# Patient Record
Sex: Female | Born: 1940
Health system: Southern US, Community
[De-identification: ages and names within clinical notes are randomized; demographics above are authoritative.]

## PROBLEM LIST (undated history)

## (undated) DIAGNOSIS — Z9889 Other specified postprocedural states: Secondary | ICD-10-CM

## (undated) DIAGNOSIS — G43909 Migraine, unspecified, not intractable, without status migrainosus: Secondary | ICD-10-CM

## (undated) DIAGNOSIS — K635 Polyp of colon: Secondary | ICD-10-CM

## (undated) DIAGNOSIS — J189 Pneumonia, unspecified organism: Secondary | ICD-10-CM

## (undated) DIAGNOSIS — Z8669 Personal history of other diseases of the nervous system and sense organs: Secondary | ICD-10-CM

## (undated) DIAGNOSIS — B351 Tinea unguium: Secondary | ICD-10-CM

## (undated) DIAGNOSIS — E039 Hypothyroidism, unspecified: Secondary | ICD-10-CM

## (undated) DIAGNOSIS — E785 Hyperlipidemia, unspecified: Secondary | ICD-10-CM

## (undated) DIAGNOSIS — M8580 Other specified disorders of bone density and structure, unspecified site: Secondary | ICD-10-CM

## (undated) HISTORY — DX: Tinea unguium: B35.1

## (undated) HISTORY — PX: TUBAL LIGATION: SHX77

## (undated) HISTORY — PX: EYE SURGERY: SHX253

## (undated) HISTORY — DX: Personal history of other diseases of the nervous system and sense organs: Z86.69

## (undated) HISTORY — PX: RETINAL DETACHMENT SURGERY: SHX105

## (undated) HISTORY — DX: Migraine, unspecified, not intractable, without status migrainosus: G43.909

## (undated) HISTORY — DX: Other specified postprocedural states: Z98.890

## (undated) HISTORY — DX: Pneumonia, unspecified organism: J18.9

## (undated) HISTORY — DX: Hyperlipidemia, unspecified: E78.5

## (undated) HISTORY — DX: Polyp of colon: K63.5

## (undated) HISTORY — DX: Other specified disorders of bone density and structure, unspecified site: M85.80

## (undated) HISTORY — DX: Hypothyroidism, unspecified: E03.9

---

## 2004-02-05 ENCOUNTER — Encounter: Admission: RE | Admit: 2004-02-05 | Discharge: 2004-02-05 | Payer: Self-pay | Admitting: Internal Medicine

## 2005-08-31 ENCOUNTER — Other Ambulatory Visit: Admission: RE | Admit: 2005-08-31 | Discharge: 2005-08-31 | Payer: Self-pay | Admitting: Obstetrics and Gynecology

## 2009-04-08 ENCOUNTER — Other Ambulatory Visit: Admission: RE | Admit: 2009-04-08 | Discharge: 2009-04-08 | Payer: Self-pay | Admitting: Obstetrics and Gynecology

## 2009-04-08 ENCOUNTER — Ambulatory Visit: Payer: Self-pay | Admitting: Women's Health

## 2010-12-31 ENCOUNTER — Encounter: Payer: Self-pay | Admitting: Gastroenterology

## 2011-08-26 ENCOUNTER — Telehealth: Payer: Self-pay

## 2011-08-26 NOTE — Telephone Encounter (Signed)
Notified husband for pt to call pharmacy to send in request

## 2011-08-26 NOTE — Telephone Encounter (Signed)
Patient made an appointment for a physical with Dr. Merla Riches in August but needs to discuss how to get her medication through her insurance company before then. Please call her to advise her of this issue.

## 2011-11-16 ENCOUNTER — Encounter: Payer: Self-pay | Admitting: Internal Medicine

## 2011-11-16 ENCOUNTER — Ambulatory Visit (INDEPENDENT_AMBULATORY_CARE_PROVIDER_SITE_OTHER): Payer: BC Managed Care – PPO | Admitting: Internal Medicine

## 2011-11-16 VITALS — BP 123/85 | HR 73 | Temp 97.0°F | Resp 18 | Ht 64.5 in | Wt 134.8 lb

## 2011-11-16 DIAGNOSIS — H3320 Serous retinal detachment, unspecified eye: Secondary | ICD-10-CM

## 2011-11-16 DIAGNOSIS — M8580 Other specified disorders of bone density and structure, unspecified site: Secondary | ICD-10-CM

## 2011-11-16 DIAGNOSIS — Z0000 Encounter for general adult medical examination without abnormal findings: Secondary | ICD-10-CM

## 2011-11-16 DIAGNOSIS — K219 Gastro-esophageal reflux disease without esophagitis: Secondary | ICD-10-CM | POA: Insufficient documentation

## 2011-11-16 DIAGNOSIS — M899 Disorder of bone, unspecified: Secondary | ICD-10-CM

## 2011-11-16 DIAGNOSIS — E039 Hypothyroidism, unspecified: Secondary | ICD-10-CM | POA: Insufficient documentation

## 2011-11-16 DIAGNOSIS — G43909 Migraine, unspecified, not intractable, without status migrainosus: Secondary | ICD-10-CM

## 2011-11-16 LAB — CBC WITH DIFFERENTIAL/PLATELET
Basophils Absolute: 0 10*3/uL (ref 0.0–0.1)
Basophils Relative: 1 % (ref 0–1)
Eosinophils Absolute: 0.2 10*3/uL (ref 0.0–0.7)
Eosinophils Relative: 3 % (ref 0–5)
HCT: 41.7 % (ref 36.0–46.0)
Hemoglobin: 15.2 g/dL — ABNORMAL HIGH (ref 12.0–15.0)
Lymphocytes Relative: 41 % (ref 12–46)
Lymphs Abs: 2.2 10*3/uL (ref 0.7–4.0)
MCH: 33.4 pg (ref 26.0–34.0)
MCHC: 36.5 g/dL — ABNORMAL HIGH (ref 30.0–36.0)
MCV: 91.6 fL (ref 78.0–100.0)
Monocytes Absolute: 0.4 10*3/uL (ref 0.1–1.0)
Monocytes Relative: 8 % (ref 3–12)
Neutro Abs: 2.7 10*3/uL (ref 1.7–7.7)
Neutrophils Relative %: 47 % (ref 43–77)
Platelets: 223 10*3/uL (ref 150–400)
RBC: 4.55 MIL/uL (ref 3.87–5.11)
RDW: 13.5 % (ref 11.5–15.5)
WBC: 5.5 10*3/uL (ref 4.0–10.5)

## 2011-11-16 NOTE — Progress Notes (Signed)
  Subjective:    Patient ID: Wanda Hernandez, female    DOB: Oct 21, 1940, 71 y.o.   MRN: [id number]  HPI    Review of Systems  Constitutional: Negative.   HENT: Negative.   Eyes: Negative.   Respiratory: Negative.   Cardiovascular: Negative.   Gastrointestinal: Negative.   Genitourinary: Negative.   Musculoskeletal: Negative.   Skin: Negative.   Neurological: Negative.   Hematological: Negative.   Psychiatric/Behavioral: Negative.        Objective:   Physical Exam        Assessment & Plan:

## 2011-11-16 NOTE — Progress Notes (Addendum)
  Subjective:    Patient ID: Wanda Hernandez, female    DOB: 06-09-1940, 71 y.o.   MRN: [id number]  HPIHere for annual physical exam and doing very well. 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 This is stressful but also prompts her to do in depth study in thinking about her livelihood. Most of her life revolves around being supportive mother Kids are out of the house: Olegario Messier moved to [place] Son in [place] lost job -IT/Wife IT also   Patient Active Problem List  Diagnosis  . Migraine-Now much less frequent/Rarely needs medication  . Osteopenia-Asymptomatic/prefers not taking medication  . Hypothyroid-Asymptomatic on replacement therapy  . Detached retina-No stable for over 10 years  . GERD (gastroesophageal reflux disease)-Now very mild needing medication once a week at most  Last colonoscopy 2005= within normal limits Avoided prescription for Zostavax for the last 2 years but will reconsider this year Skipped mammogram this yearAnd GYN exam for the last 2 years   Home BP-All normal/she has concern that she'll develop hypertension when she feels stressed Review of Systems 13 point review of systems on her pink datasheet are all negative    Objective:   Physical Exam Filed Vitals:   11/16/11 0846  BP: 123/85  Pulse: 73  Temp: 97 F (36.1 C)  Resp: 18  Well-developed well-nourished HEENT clear No thyromegaly or lymphadenopathy Heart regular without murmur Lungs clear Abdomen soft with no organomegaly masses or bruits Extremities clear full range of motion of major joints and full peripheral pulses Neuro intact Psychiatric intact Breasts with no masses or dimpling Skin clear     Assessment & Plan:  Annual physical examination Problem #1 hypothyroidism-recheck labs Problem #2 osteopenia-check calcium and vitamin D Other problems stable   Adden: synthr incr to 11/18/11

## 2011-11-17 LAB — COMPREHENSIVE METABOLIC PANEL
ALT: 19 U/L (ref 0–35)
AST: 22 U/L (ref 0–37)
Albumin: 4.9 g/dL (ref 3.5–5.2)
Alkaline Phosphatase: 50 U/L (ref 39–117)
BUN: 24 mg/dL — ABNORMAL HIGH (ref 6–23)
CO2: 26 mEq/L (ref 19–32)
Calcium: 9.9 mg/dL (ref 8.4–10.5)
Chloride: 103 mEq/L (ref 96–112)
Creat: 0.94 mg/dL (ref 0.50–1.10)
Glucose, Bld: 85 mg/dL (ref 70–99)
Potassium: 3.7 mEq/L (ref 3.5–5.3)
Sodium: 137 mEq/L (ref 135–145)
Total Bilirubin: 0.8 mg/dL (ref 0.3–1.2)
Total Protein: 6.8 g/dL (ref 6.0–8.3)

## 2011-11-17 LAB — TSH: TSH: 7.922 u[IU]/mL — ABNORMAL HIGH (ref 0.350–4.500)

## 2011-11-17 LAB — LIPID PANEL
Cholesterol: 233 mg/dL — ABNORMAL HIGH (ref 0–200)
HDL: 63 mg/dL (ref >39)
LDL Cholesterol: 144 mg/dL — ABNORMAL HIGH (ref 0–99)
Total CHOL/HDL Ratio: 3.7 Ratio
Triglycerides: 128 mg/dL (ref <150)
VLDL: 26 mg/dL (ref 0–40)

## 2011-11-17 LAB — VITAMIN D 25 HYDROXY (VIT D DEFICIENCY, FRACTURES): Vit D, 25-Hydroxy: 41 ng/mL (ref 30–89)

## 2011-11-17 LAB — T4, FREE: Free T4: 1.45 ng/dL (ref 0.80–1.80)

## 2011-11-18 ENCOUNTER — Other Ambulatory Visit: Payer: Self-pay | Admitting: Physician Assistant

## 2011-11-18 ENCOUNTER — Other Ambulatory Visit: Payer: Self-pay | Admitting: Internal Medicine

## 2011-11-18 MED ORDER — LEVOTHYROXINE SODIUM 100 MCG PO TABS: 100.0000 ug | ORAL_TABLET | Freq: Every day | ORAL | Status: DC

## 2011-11-18 NOTE — Addendum Note (Signed)
Addended by: Tonye Pearson on: 11/18/2011 09:39 AM   Modules accepted: Orders

## 2011-11-18 NOTE — Addendum Note (Signed)
Addended by: Jacqualyn Posey on: 11/18/2011 10:42 AM   Modules accepted: Orders

## 2011-11-18 NOTE — Telephone Encounter (Signed)
Dr. Merla Riches - please advise. Looks like her labs have not been reviewed and she may be under-treated.

## 2011-11-20 ENCOUNTER — Encounter: Payer: Self-pay | Admitting: Internal Medicine

## 2011-12-19 ENCOUNTER — Telehealth: Payer: Self-pay | Admitting: Obstetrics and Gynecology

## 2011-12-20 ENCOUNTER — Encounter: Payer: Self-pay | Admitting: Gastroenterology

## 2012-01-10 ENCOUNTER — Encounter: Payer: Self-pay | Admitting: Obstetrics and Gynecology

## 2012-01-10 ENCOUNTER — Ambulatory Visit (INDEPENDENT_AMBULATORY_CARE_PROVIDER_SITE_OTHER): Payer: BC Managed Care – PPO | Admitting: Obstetrics and Gynecology

## 2012-01-10 VITALS — BP 130/86 | HR 72 | Ht 65.0 in | Wt 136.0 lb

## 2012-01-10 DIAGNOSIS — Z124 Encounter for screening for malignant neoplasm of cervix: Secondary | ICD-10-CM

## 2012-01-10 NOTE — Progress Notes (Signed)
Last Pap: 2007 WNL: Yes Regular Periods:no Contraception: post menopausal  Monthly Breast exam:yes Tetanus<36yrs:no Nl.Bladder Function:yes Daily BMs:yes Healthy Diet:yes Calcium:yes in multivitamin Mammogram:yes Date of Mammogram: Pt has not had one in a few years Exercise:yes Have often Exercise: walking 3 times per week  Seatbelt: yes Abuse at home: no Stressful work:no Sigmoid-colonoscopy: years ago per pt, PCP has info Bone Density: Yes  PC[facility]: [facility] [address]  Change in PMH: n/a Change in FMH:n/a BP 130/86  Pulse 72  Ht 5\' 5"  (1.651 m)  Wt 136 lb (61.689 kg)  BMI 22.63 kg/m2 Pt with complaints:, yes and she thinks she may have a hernia on the lrft side of her abdomen.  She has no pain.   Physical Examination: General appearance - alert, well appearing, and in no distress Mental status - normal mood, behavior, speech, dress, motor activity, and thought processes Neck - supple, no significant adenopathy,  thyroid exam: thyroid is normal in size without nodules or tenderness Chest - clear to auscultation, no wheezes, rales or rhonchi, symmetric air entry Heart - normal rate and regular rhythm Abdomen - soft, nontender, nondistended, no masses or organomegaly.  No hernia palpated.   Breasts - breasts appear normal, no suspicious masses, no skin or nipple changes or axillary nodes Pelvic - normal external genitalia, vulva, vagina, cervix, uterus and adnexa Rectal - declined by the patient Back exam - full range of motion, no tenderness, palpable spasm or pain on motion Neurological - alert, oriented, normal speech, no focal findings or movement disorder noted Musculoskeletal - no joint tenderness, deformity or swelling Extremities - no edema, redness or tenderness in the calves or thighs Skin - normal coloration and turgor, no rashes, no suspicious skin lesions noted Routine exam Pap sent yes if normal repeat in 3 yrs Mammogram due yes will  scheudle BTL used for contraception RT 1 yr Pt told if her abdomen becomes worse f/u with PCP

## 2012-01-10 NOTE — Patient Instructions (Signed)
Exercise to Stay Healthy Exercise helps you become and stay healthy. EXERCISE IDEAS AND TIPS Choose exercises that:  You enjoy.  Fit into your day. You do not need to exercise really hard to be healthy. You can do exercises at a slow or medium level and stay healthy. You can:  Stretch before and after working out.  Try yoga, Pilates, or tai chi.  Lift weights.  Walk fast, swim, jog, run, climb stairs, bicycle, dance, or rollerskate.  Take aerobic classes. Exercises that burn about 150 calories:  Running 1  miles in 15 minutes.  Playing volleyball for 45 to 60 minutes.  Washing and waxing a car for 45 to 60 minutes.  Playing touch football for 45 minutes.  Walking 1  miles in 35 minutes.  Pushing a stroller 1  miles in 30 minutes.  Playing basketball for 30 minutes.  Raking leaves for 30 minutes.  Bicycling 5 miles in 30 minutes.  Walking 2 miles in 30 minutes.  Dancing for 30 minutes.  Shoveling snow for 15 minutes.  Swimming laps for 20 minutes.  Walking up stairs for 15 minutes.  Bicycling 4 miles in 15 minutes.  Gardening for 30 to 45 minutes.  Jumping rope for 15 minutes.  Washing windows or floors for 45 to 60 minutes. Document Released: 04/16/2010 Document Revised: 06/06/2011 Document Reviewed: 04/16/2010 Exit[employer] Patient Information 2013 [employer].  

## 2012-01-11 LAB — PAP IG W/ RFLX HPV ASCU

## 2012-02-03 DIAGNOSIS — H2510 Age-related nuclear cataract, unspecified eye: Secondary | ICD-10-CM | POA: Diagnosis not present

## 2012-02-21 ENCOUNTER — Telehealth: Payer: Self-pay

## 2012-02-21 MED ORDER — SYNTHROID 100 MCG PO TABS: 100.0000 ug | ORAL_TABLET | Freq: Every day | ORAL | Status: DC

## 2012-02-21 NOTE — Telephone Encounter (Signed)
Patient states Synthroid has not been refilled. She uses [facility] and by the first of next week she will be out. Patient states she has to have name brand and no generics. [phone] on to leave message

## 2012-02-21 NOTE — Telephone Encounter (Signed)
Pt reported that [facility] told her that they have released the last of her synthroid Rx and she doesn't know why they don't have the 3 RFs that were put on Rx (or maybe a PA is needed?). Called [facility] and a new Rx is needed stating DAW for the rest of the Rxs. I was told that no PA is needed but pt will have to pay the higher co-pay. Sending in Rx for the remaining RFs from Dr Doolittle's Rx from 10/2011 w/note DAW - name brand medically necessary. Notified pt of status.

## 2012-04-23 ENCOUNTER — Other Ambulatory Visit: Payer: Self-pay | Admitting: Physician Assistant

## 2012-05-24 ENCOUNTER — Other Ambulatory Visit: Payer: Self-pay | Admitting: Radiology

## 2012-05-24 NOTE — Telephone Encounter (Signed)
Please advise on name brand Synthroid for him.

## 2012-05-25 MED ORDER — LANSOPRAZOLE 30 MG PO CPDR: 30.0000 mg | DELAYED_RELEASE_CAPSULE | Freq: Every day | ORAL | Status: DC

## 2012-05-25 MED ORDER — SYNTHROID 100 MCG PO TABS: 100.0000 ug | ORAL_TABLET | Freq: Every day | ORAL | Status: DC

## 2012-12-26 ENCOUNTER — Encounter: Payer: Medicare Other | Admitting: Internal Medicine

## 2013-02-06 ENCOUNTER — Encounter: Payer: Self-pay | Admitting: Internal Medicine

## 2013-02-06 ENCOUNTER — Ambulatory Visit (INDEPENDENT_AMBULATORY_CARE_PROVIDER_SITE_OTHER): Payer: Medicare Other | Admitting: Internal Medicine

## 2013-02-06 VITALS — BP 133/92 | HR 68 | Temp 98.4°F | Wt 136.6 lb

## 2013-02-06 DIAGNOSIS — E039 Hypothyroidism, unspecified: Secondary | ICD-10-CM | POA: Diagnosis not present

## 2013-02-06 DIAGNOSIS — Z0000 Encounter for general adult medical examination without abnormal findings: Secondary | ICD-10-CM | POA: Diagnosis not present

## 2013-02-06 LAB — COMPREHENSIVE METABOLIC PANEL
ALT: 27 U/L (ref 0–35)
AST: 23 U/L (ref 0–37)
Albumin: 4.2 g/dL (ref 3.5–5.2)
Alkaline Phosphatase: 49 U/L (ref 39–117)
BUN: 24 mg/dL — ABNORMAL HIGH (ref 6–23)
CO2: 24 mEq/L (ref 19–32)
Calcium: 9.2 mg/dL (ref 8.4–10.5)
Chloride: 109 mEq/L (ref 96–112)
Creat: 0.82 mg/dL (ref 0.50–1.10)
Glucose, Bld: 89 mg/dL (ref 70–99)
Potassium: 4 mEq/L (ref 3.5–5.3)
Sodium: 142 mEq/L (ref 135–145)
Total Bilirubin: 0.5 mg/dL (ref 0.3–1.2)
Total Protein: 6.5 g/dL (ref 6.0–8.3)

## 2013-02-06 LAB — LIPID PANEL
Cholesterol: 187 mg/dL (ref 0–200)
HDL: 64 mg/dL (ref >39)
LDL Cholesterol: 106 mg/dL — ABNORMAL HIGH (ref 0–99)
Total CHOL/HDL Ratio: 2.9 Ratio
Triglycerides: 84 mg/dL (ref <150)
VLDL: 17 mg/dL (ref 0–40)

## 2013-02-06 LAB — CBC WITH DIFFERENTIAL/PLATELET
Basophils Absolute: 0 10*3/uL (ref 0.0–0.1)
Basophils Relative: 0 % (ref 0–1)
Eosinophils Absolute: 0.1 10*3/uL (ref 0.0–0.7)
Eosinophils Relative: 3 % (ref 0–5)
HCT: 41.9 % (ref 36.0–46.0)
Hemoglobin: 15.2 g/dL — ABNORMAL HIGH (ref 12.0–15.0)
Lymphocytes Relative: 40 % (ref 12–46)
Lymphs Abs: 1.6 10*3/uL (ref 0.7–4.0)
MCH: 33 pg (ref 26.0–34.0)
MCHC: 36.3 g/dL — ABNORMAL HIGH (ref 30.0–36.0)
MCV: 91.1 fL (ref 78.0–100.0)
Monocytes Absolute: 0.4 10*3/uL (ref 0.1–1.0)
Monocytes Relative: 9 % (ref 3–12)
Neutro Abs: 2 10*3/uL (ref 1.7–7.7)
Neutrophils Relative %: 48 % (ref 43–77)
Platelets: 199 10*3/uL (ref 150–400)
RBC: 4.6 MIL/uL (ref 3.87–5.11)
RDW: 14.1 % (ref 11.5–15.5)
WBC: 4.1 10*3/uL (ref 4.0–10.5)

## 2013-02-06 LAB — TSH: TSH: 1.294 u[IU]/mL (ref 0.350–4.500)

## 2013-02-06 LAB — T4, FREE: Free T4: 1.45 ng/dL (ref 0.80–1.80)

## 2013-02-06 MED ORDER — CYCLOBENZAPRINE HCL 10 MG PO TABS: 10.0000 mg | ORAL_TABLET | Freq: Every day | ORAL | Status: DC

## 2013-02-06 NOTE — Progress Notes (Signed)
  Subjective:    Patient ID: Wanda Hernandez, female    DOB: May 12, 1940, 72 y.o.   MRN: [id number]  HPI Patient presents today for annual exam. Mammo- overdue Pap- last year, nml Colonoscopy- 2005 Flu- refuses  BP at home 119-125/78-85.  Occasionally pulls a muscle in her back with exercise and is requesting a little flexeril to keep on hand.  Rarely uses lansoprazole for GERD.  Energy level normal, no difficulty doing the things she wants to do. Walks for exercise.  Review of Systems  Constitutional: Negative.   HENT: Negative.   Eyes: Negative.   Respiratory: Negative.   Cardiovascular: Negative.   Gastrointestinal: Negative.   Endocrine: Negative.   Genitourinary: Negative.   Musculoskeletal: Negative.   Skin: Negative.   Allergic/Immunologic: Negative.   Neurological: Negative.   Hematological: Negative.   Psychiatric/Behavioral: Negative.        Objective:   Physical Exam  Nursing note and vitals reviewed. Constitutional: She is oriented to person, place, and time. She appears well-developed and well-nourished.  HENT:  Right Ear: Tympanic membrane, external ear and ear canal normal.  Left Ear: Tympanic membrane, external ear and ear canal normal.  Eyes: Conjunctivae are normal. Pupils are equal, round, and reactive to light. Right eye exhibits no discharge. Left eye exhibits no discharge.  Neck: Normal range of motion. Neck supple. Carotid bruit is not present.  Cardiovascular: Normal rate, regular rhythm, normal heart sounds and intact distal pulses.   Pulmonary/Chest: Effort normal and breath sounds normal. Right breast exhibits no inverted nipple, no mass, no nipple discharge, no skin change and no tenderness. Left breast exhibits no inverted nipple, no mass, no nipple discharge, no skin change and no tenderness. Breasts are symmetrical.  Abdominal: Soft. Bowel sounds are normal.  Musculoskeletal: Normal range of motion. She exhibits no edema.  Neurological:  She is alert and oriented to person, place, and time. She has normal reflexes.  Skin: Skin is warm and dry.  Psychiatric: She has a normal mood and affect. Her behavior is normal. Judgment and thought content normal.          Assessment & Plan:  Annual exam hypothy  Labs and adj meds

## 2013-02-06 NOTE — Progress Notes (Signed)
  Subjective:    Patient ID: Wanda Hernandez, female    DOB: Dec 29, 1940, 72 y.o.   MRN: [id number]  HPI    Review of Systems  Constitutional: Negative.   HENT: Negative.   Eyes: Negative.   Respiratory: Negative.   Cardiovascular: Negative.   Gastrointestinal: Negative.   Endocrine: Negative.   Genitourinary: Negative.   Musculoskeletal: Negative.   Skin: Negative.   Allergic/Immunologic: Negative.   Neurological: Negative.   Hematological: Negative.   Psychiatric/Behavioral: Negative.        Objective:   Physical Exam        Assessment & Plan:

## 2013-02-12 ENCOUNTER — Encounter: Payer: Self-pay | Admitting: Internal Medicine

## 2013-02-12 ENCOUNTER — Other Ambulatory Visit: Payer: Self-pay | Admitting: Internal Medicine

## 2013-02-12 DIAGNOSIS — Z1231 Encounter for screening mammogram for malignant neoplasm of breast: Secondary | ICD-10-CM

## 2013-04-23 ENCOUNTER — Other Ambulatory Visit: Payer: Self-pay | Admitting: Internal Medicine

## 2013-04-23 ENCOUNTER — Ambulatory Visit
Admission: RE | Admit: 2013-04-23 | Discharge: 2013-04-23 | Disposition: A | Discharge status: Home / Self Care | Payer: Medicare Other | Source: Ambulatory Visit | Attending: Internal Medicine | Admitting: Internal Medicine

## 2013-04-23 DIAGNOSIS — Z1231 Encounter for screening mammogram for malignant neoplasm of breast: Secondary | ICD-10-CM

## 2013-04-25 ENCOUNTER — Telehealth: Payer: Self-pay

## 2013-04-25 ENCOUNTER — Other Ambulatory Visit: Payer: Self-pay | Admitting: Physician Assistant

## 2013-04-25 MED ORDER — SYNTHROID 100 MCG PO TABS: 100.0000 ug | ORAL_TABLET | Freq: Every day | ORAL | Status: DC

## 2013-04-25 NOTE — Telephone Encounter (Signed)
Last OV states refill medications for 1 year.  Sent in refill request.  Pt advised.

## 2013-04-25 NOTE — Telephone Encounter (Signed)
Patient called to request a refill on her Synthroid medication. She would like a "one year supply" of the medicine as she normally receives by Dr. Laney Pastor. Please return her call at [phone]. and let her know when the rx has been sent to her pharmacy ([facility] [place], [place] phone # [phone]). She states that she has 2 pills left and needs the rx sent ASAP. Thank you!

## 2013-04-26 ENCOUNTER — Telehealth: Payer: Self-pay

## 2013-04-26 NOTE — Telephone Encounter (Signed)
Patient called that she called [place] a couple of days ago regarding her medication request and no one has returned her call. Please call patient ASAP. She is upset that no one called her.   Thank You!!!

## 2013-04-26 NOTE — Telephone Encounter (Signed)
Spoke with patient and her husband was able to get medication

## 2013-04-27 ENCOUNTER — Ambulatory Visit (INDEPENDENT_AMBULATORY_CARE_PROVIDER_SITE_OTHER): Payer: Medicare Other | Admitting: Internal Medicine

## 2013-04-27 VITALS — BP 132/100 | HR 91 | Temp 98.8°F | Resp 16 | Ht 64.5 in | Wt 137.0 lb

## 2013-04-27 DIAGNOSIS — R05 Cough: Secondary | ICD-10-CM | POA: Diagnosis not present

## 2013-04-27 DIAGNOSIS — R059 Cough, unspecified: Secondary | ICD-10-CM

## 2013-04-27 DIAGNOSIS — J029 Acute pharyngitis, unspecified: Secondary | ICD-10-CM

## 2013-04-27 DIAGNOSIS — R509 Fever, unspecified: Secondary | ICD-10-CM | POA: Diagnosis not present

## 2013-04-27 LAB — POCT CBC
Granulocyte percent: 58.2 %G (ref 37–80)
HCT, POC: 47 % (ref 37.7–47.9)
Hemoglobin: 15.1 g/dL (ref 12.2–16.2)
Lymph, poc: 1.2 (ref 0.6–3.4)
MCH, POC: 31.3 pg — AB (ref 27–31.2)
MCHC: 32.1 g/dL (ref 31.8–35.4)
MCV: 97.3 fL — AB (ref 80–97)
MID (cbc): 0.4 (ref 0–0.9)
MPV: 9.8 fL (ref 0–99.8)
POC Granulocyte: 2.2 (ref 2–6.9)
POC LYMPH PERCENT: 31.3 %L (ref 10–50)
POC MID %: 10.5 %M (ref 0–12)
Platelet Count, POC: 170 10*3/uL (ref 142–424)
RBC: 4.83 M/uL (ref 4.04–5.48)
RDW, POC: 14.5 %
WBC: 3.7 10*3/uL — AB (ref 4.6–10.2)

## 2013-04-27 MED ORDER — HYDROCODONE-ACETAMINOPHEN 5-300 MG PO TABS: 5.0000 mg | ORAL_TABLET | Freq: Two times a day (BID) | ORAL | Status: DC | PRN

## 2013-04-27 NOTE — Progress Notes (Signed)
Subjective:    Patient ID: Wanda Hernandez, female    DOB: Aug 18, 1940, 73 y.o.   MRN: [id number] This chart was scribed for Tami Lin, MD by Vernell Barrier, Medical Scribe. This patient's care was started at 1:29 PM.  Cough Associated symptoms include headaches.  Headache   Sore Throat    HPI Comments: Wanda Hernandez is a 73 y.o. female who presents to the [facility] complaining of constant, painful, sometimes productive cough, initial onset 3 weeks ago. Most recent onset of cough began 1 day ago. Pt states she is also fatigued, reporting not wanting to do anything. Pt states sx 3 weeks ago were low grade temperature, congestion, and mild rhinorrhea. Pt states sx would resolve but then eventually return. Pt took Mucinex and Zyrtec to relieve sx. States Mucinex would give her severe HA so she has since discontinued. Pt states he also cannot use cough drops because of the sugar and artificial sweetner; report they give her HA. Denies chills and ear pain.   Review of Systems Objective:  Physical Exam  Vitals reviewed. Constitutional: She is oriented to person, place, and time. She appears well-developed and well-nourished. No distress.  HENT:  Head: Normocephalic and atraumatic.  Right Ear: External ear normal.  Left Ear: External ear normal.  Nose: Nose normal.  Mouth/Throat: Posterior oropharyngeal erythema present.  Eyes: EOM are normal. Pupils are equal, round, and reactive to light.  Neck: Neck supple. No thyromegaly present.  Cardiovascular: Normal rate, regular rhythm and normal heart sounds.  Exam reveals no gallop and no friction rub.   No murmur heard. Pulmonary/Chest: Effort normal and breath sounds normal. No respiratory distress. She has no wheezes. She has no rales.  Musculoskeletal: Normal range of motion.  Lymphadenopathy:    She has no cervical adenopathy.  Neurological: She is alert and oriented to person, place, and time.  Skin:  Skin is warm and dry.  Psychiatric: She has a normal mood and affect. Her behavior is normal.   Results for orders placed in visit on 04/27/13  POCT CBC      Result Value Range   WBC 3.7 (*) 4.6 - 10.2 K/uL   Lymph, poc 1.2  0.6 - 3.4   POC LYMPH PERCENT 31.3  10 - 50 %L   MID (cbc) 0.4  0 - 0.9   POC MID % 10.5  0 - 12 %M   POC Granulocyte 2.2  2 - 6.9   Granulocyte percent 58.2  37 - 80 %G   RBC 4.83  4.04 - 5.48 M/uL   Hemoglobin 15.1  12.2 - 16.2 g/dL   HCT, POC 47.0  37.7 - 47.9 %   MCV 97.3 (*) 80 - 97 fL   MCH, POC 31.3 (*) 27 - 31.2 pg   MCHC 32.1  31.8 - 35.4 g/dL   RDW, POC 14.5     Platelet Count, POC 170  142 - 424 K/uL   MPV 9.8  0 - 99.8 fL   Assessment & Plan:  Pt will have WBC count completed.  Pt is advised she can use peppermint leaves and put in a strong tea to provide some relief, because she cannot tolerate sugar or artificial sweeteners without headaches.  I have completed the patient encounter in its entirety as documented by the scribe, with editing by me where necessary. Wanda Hernandez, M.D. Fever, unspecified - Plan: POCT CBC  Cough  Acute pharyngitis - Plan: Culture, Group A  Strep   Meds ordered this encounter  Medications  . Hydrocodone-Acetaminophen 5-300 MG TABS    Sig: Take 5 mg by mouth 2 (two) times daily as needed. For cough and throat pain    Dispense:  10 each    Refill:  0

## 2013-04-29 LAB — CULTURE, GROUP A STREP: Organism ID, Bacteria: NORMAL

## 2013-06-25 ENCOUNTER — Ambulatory Visit (INDEPENDENT_AMBULATORY_CARE_PROVIDER_SITE_OTHER): Payer: Medicare Other | Admitting: Internal Medicine

## 2013-06-25 ENCOUNTER — Telehealth: Payer: Self-pay

## 2013-06-25 VITALS — BP 134/76 | HR 74 | Temp 98.0°F | Resp 18 | Ht 64.0 in | Wt 135.0 lb

## 2013-06-25 DIAGNOSIS — R110 Nausea: Secondary | ICD-10-CM | POA: Diagnosis not present

## 2013-06-25 DIAGNOSIS — R319 Hematuria, unspecified: Secondary | ICD-10-CM

## 2013-06-25 DIAGNOSIS — R300 Dysuria: Secondary | ICD-10-CM

## 2013-06-25 DIAGNOSIS — R5381 Other malaise: Secondary | ICD-10-CM | POA: Diagnosis not present

## 2013-06-25 DIAGNOSIS — R51 Headache: Secondary | ICD-10-CM | POA: Diagnosis not present

## 2013-06-25 DIAGNOSIS — R5383 Other fatigue: Secondary | ICD-10-CM

## 2013-06-25 LAB — POCT UA - MICROSCOPIC ONLY
Casts, Ur, LPF, POC: NEGATIVE
Crystals, Ur, HPF, POC: NEGATIVE
Mucus, UA: NEGATIVE
Yeast, UA: NEGATIVE

## 2013-06-25 LAB — POCT URINALYSIS DIPSTICK
Bilirubin, UA: NEGATIVE
Glucose, UA: NEGATIVE
Ketones, UA: NEGATIVE
Nitrite, UA: POSITIVE
Protein, UA: NEGATIVE
Spec Grav, UA: <=1.005
Urobilinogen, UA: 0.2
pH, UA: 5

## 2013-06-25 MED ORDER — CIPROFLOXACIN HCL 250 MG PO TABS: 250.0000 mg | ORAL_TABLET | Freq: Two times a day (BID) | ORAL | Status: DC

## 2013-06-25 NOTE — Telephone Encounter (Signed)
LMOM for pt to cb °

## 2013-06-25 NOTE — Telephone Encounter (Signed)
Patient lives in [place], [place].   She has another UTI and would like dr  Laney Pastor to [facility] [address] [facility] [place], [place]

## 2013-06-25 NOTE — Telephone Encounter (Signed)
spoek to pt, she is aware she will need to have an OV for evaluation. She wanted her to be placed on a list to be seen by Dr, Laney Pastor, i explained to her we could not do that as we are a walk in clinic, first come first served. She understood and will be in today to an OV.

## 2013-06-25 NOTE — Progress Notes (Signed)
   Subjective:    Patient ID: Wanda Hernandez, female    DOB: 30-Jan-1941, 73 y.o.   MRN: [id number] This chart was scribed for Tami Lin, MD by Vernell Barrier, Medical Scribe. This patient's care was started at 5:53 PM.  HPI HPI Comments: Wanda Hernandez is a 73 y.o. female who presents to the [facility] complaining of burning with urination, HA, nausea, weakness, onset 1 day ago. Has been using OTC medication and drinking plenty of water to get rid of burning. Says this has been working. Denies hematuria or abdominal pain. But today feels worse with headache, nausea, continued dysuria, and feeling weak all over. No fever or chills. She actually just returned from a weeklong trip to [place] and started her dysuria while she was out there or even prior to that Last urinary tract infection 2-3 years ago. She denies hematuria and has never had a stone.  Review of Systems  Gastrointestinal: Positive for nausea. Negative for abdominal pain.  Genitourinary: Positive for dysuria. Negative for hematuria.  Neurological: Positive for weakness and headaches.   Objective:  Physical Exam  Vitals reviewed. Constitutional: She is oriented to person, place, and time. She appears well-developed and well-nourished. No distress.  HENT:  Head: Normocephalic and atraumatic.  Eyes: EOM are normal.  Neck: Neck supple.  Cardiovascular: Normal rate.   Pulmonary/Chest: Effort normal. No respiratory distress.  Abdominal: Soft. There is no tenderness.  No CVA tenderness to percussion  Musculoskeletal: Normal range of motion.  Lymphadenopathy:    She has no cervical adenopathy.  Neurological: She is alert and oriented to person, place, and time.  Skin: Skin is warm and dry.  Psychiatric: She has a normal mood and affect. Her behavior is normal.   Results for orders placed in visit on 06/25/13  POCT UA - MICROSCOPIC ONLY      Result Value Ref Range   WBC, Ur, HPF, POC 0-6     RBC, urine, microscopic 4-TNTC     Bacteria, U Microscopic 2+     Mucus, UA neg     Epithelial cells, urine per micros 0-4     Crystals, Ur, HPF, POC neg     Casts, Ur, LPF, POC neg     Yeast, UA neg    POCT URINALYSIS DIPSTICK      Result Value Ref Range   Color, UA orange     Clarity, UA clear     Glucose, UA neg     Bilirubin, UA neg     Ketones, UA neg     Spec Grav, UA <=1.005     Blood, UA small     pH, UA 5.0     Protein, UA neg     Urobilinogen, UA 0.2     Nitrite, UA pos     Leukocytes, UA small (1+)      Assessment & Plan:  Dysuria with positive nitrites suggesting UTI Hematuria  Culture done/start ten-day course of Cipro/will need UA in followup to prove hematuria has cleared  I have completed the patient encounter in its entirety as documented by the scribe, with editing by me where necessary. Clarisse Rodriges P. Laney Pastor, M.D.

## 2013-06-27 LAB — URINE CULTURE: Colony Count: 75000

## 2013-09-11 ENCOUNTER — Ambulatory Visit (INDEPENDENT_AMBULATORY_CARE_PROVIDER_SITE_OTHER): Payer: Medicare Other | Admitting: Emergency Medicine

## 2013-09-11 VITALS — BP 150/90 | HR 84 | Temp 98.5°F | Resp 16 | Ht 64.75 in | Wt 136.8 lb

## 2013-09-11 DIAGNOSIS — R350 Frequency of micturition: Secondary | ICD-10-CM | POA: Diagnosis not present

## 2013-09-11 DIAGNOSIS — N3000 Acute cystitis without hematuria: Secondary | ICD-10-CM

## 2013-09-11 DIAGNOSIS — R300 Dysuria: Secondary | ICD-10-CM

## 2013-09-11 LAB — POCT UA - MICROSCOPIC ONLY
Casts, Ur, LPF, POC: NEGATIVE
Crystals, Ur, HPF, POC: NEGATIVE
Mucus, UA: NEGATIVE
Yeast, UA: NEGATIVE

## 2013-09-11 LAB — POCT URINALYSIS DIPSTICK
Bilirubin, UA: NEGATIVE
Glucose, UA: NEGATIVE
Ketones, UA: NEGATIVE
Nitrite, UA: POSITIVE
Protein, UA: NEGATIVE
Spec Grav, UA: <=1.005
Urobilinogen, UA: 0.2
pH, UA: 5

## 2013-09-11 MED ORDER — PHENAZOPYRIDINE HCL 200 MG PO TABS: 200.0000 mg | ORAL_TABLET | Freq: Three times a day (TID) | ORAL | Status: DC | PRN

## 2013-09-11 MED ORDER — NITROFURANTOIN MONOHYD MACRO 100 MG PO CAPS: 100.0000 mg | ORAL_CAPSULE | Freq: Two times a day (BID) | ORAL | Status: DC

## 2013-09-11 NOTE — Progress Notes (Signed)
[facility] [address], [place] [address] [phone]  Date:  09/11/2013   Name:  Wanda Hernandez   DOB:  [birth year]   MRN:  [id number]  PCP:  Leandrew Koyanagi, MD    Chief Complaint: Urinary Tract Infection   History of Present Illness:  Wanda Hernandez is a 73 y.o. very pleasant female patient who presents with the following:  Ill with low back pain that is rather vague.  She has dysuria urgency and frequency.  No fever or chills.  No nausea or vomiting.  No GYN symptoms.  No stool change.  No hematuria.  History of prior cystitis.  No improvement with over the counter medications or other home remedies. Denies other complaint or health concern today.   Patient Active Problem List   Diagnosis Date Noted  . Detached retina 11/16/2011  . GERD (gastroesophageal reflux disease) 11/16/2011  . Migraine   . Osteopenia   . Hypothyroid     Past Medical History  Diagnosis Date  . Migraine   . Osteopenia   . History of detached retina repair   . Colon polyp   . Hypothyroid   . Onychomycosis     Past Surgical History  Procedure Laterality Date  . Eye surgery    . Tubal ligation      History  Substance Use Topics  . Smoking status: Former Research scientist (life sciences)  . Smokeless tobacco: Not on file  . Alcohol Use: No    Family History  Problem Relation Age of Onset  . Alzheimer's disease Mother   . Heart disease Father   . Breast cancer Mother     Allergies  Allergen Reactions  . Sulfa Antibiotics     Medication list has been reviewed and updated.  Current Outpatient Prescriptions on File Prior to Visit  Medication Sig Dispense Refill  . b complex vitamins tablet Take 1 tablet by mouth daily.      . Multiple Vitamin (MULTIVITAMIN) tablet Take 1 tablet by mouth daily.      Marland Kitchen SYNTHROID 100 MCG tablet Take 1 tablet (100 mcg total) by mouth daily. Name brand medically necessary  90 tablet  3  . ciprofloxacin (CIPRO) 250 MG tablet Take 1 tablet (250  mg total) by mouth 2 (two) times daily.  20 tablet  0  . cyclobenzaprine (FLEXERIL) 10 MG tablet Take 1 tablet (10 mg total) by mouth at bedtime.  30 tablet  0  . Hydrocodone-Acetaminophen 5-300 MG TABS Take 5 mg by mouth 2 (two) times daily as needed. For cough and throat pain  10 each  0  . lansoprazole (PREVACID) 30 MG capsule Take 1 capsule (30 mg total) by mouth daily.  90 capsule  2   No current facility-administered medications on file prior to visit.    Review of Systems:  As per HPI, otherwise negative.    Physical Examination: Filed Vitals:   09/11/13 1751  BP: 150/90  Pulse: 84  Temp: 98.5 F (36.9 C)  Resp: 16   Filed Vitals:   09/11/13 1751  Height: 5' 4.75" (1.645 m)  Weight: 136 lb 12.8 oz (62.052 kg)   Body mass index is 22.93 kg/(m^2). Ideal Body Weight: Weight in (lb) to have BMI = 25: 148.8   GEN: WDWN, NAD, Non-toxic, Alert & Oriented x 3 HEENT: Atraumatic, Normocephalic.  Ears and Nose: No external deformity. EXTR: No clubbing/cyanosis/edema NEURO: Normal gait.  PSYCH: Normally interactive. Conversant. Not depressed or anxious appearing.  Calm demeanor.  No CVA tenderness  Assessment and Plan: Cystitis macrobid Pyridium  Signed,  Ellison Carwin, MD   Results for orders placed in visit on 09/11/13  POCT UA - MICROSCOPIC ONLY      Result Value Ref Range   WBC, Ur, HPF, POC 20-30     RBC, urine, microscopic 2-4     Bacteria, U Microscopic 1+     Mucus, UA neg     Epithelial cells, urine per micros 1-3     Crystals, Ur, HPF, POC neg     Casts, Ur, LPF, POC neg     Yeast, UA neg    POCT URINALYSIS DIPSTICK      Result Value Ref Range   Color, UA yellow     Clarity, UA hazy     Glucose, UA neg     Bilirubin, UA neg     Ketones, UA neg     Spec Grav, UA <=1.005     Blood, UA small     pH, UA 5.0     Protein, UA neg     Urobilinogen, UA 0.2     Nitrite, UA positive     Leukocytes, UA moderate (2+)

## 2013-09-11 NOTE — Patient Instructions (Signed)

## 2013-09-14 LAB — URINE CULTURE: Colony Count: >=100000

## 2013-10-11 ENCOUNTER — Encounter: Payer: Self-pay | Admitting: Gastroenterology

## 2014-01-22 ENCOUNTER — Encounter: Payer: Medicare Other | Admitting: Internal Medicine

## 2014-04-02 ENCOUNTER — Encounter: Payer: Self-pay | Admitting: Internal Medicine

## 2014-04-02 ENCOUNTER — Ambulatory Visit (INDEPENDENT_AMBULATORY_CARE_PROVIDER_SITE_OTHER): Payer: Medicare HMO | Admitting: Internal Medicine

## 2014-04-02 VITALS — BP 151/92 | HR 72 | Temp 97.6°F | Resp 16 | Ht 65.0 in | Wt 135.0 lb

## 2014-04-02 DIAGNOSIS — N390 Urinary tract infection, site not specified: Secondary | ICD-10-CM

## 2014-04-02 DIAGNOSIS — Z23 Encounter for immunization: Secondary | ICD-10-CM

## 2014-04-02 DIAGNOSIS — Z0000 Encounter for general adult medical examination without abnormal findings: Secondary | ICD-10-CM

## 2014-04-02 DIAGNOSIS — R05 Cough: Secondary | ICD-10-CM

## 2014-04-02 DIAGNOSIS — B351 Tinea unguium: Secondary | ICD-10-CM

## 2014-04-02 DIAGNOSIS — E039 Hypothyroidism, unspecified: Secondary | ICD-10-CM

## 2014-04-02 DIAGNOSIS — R059 Cough, unspecified: Secondary | ICD-10-CM

## 2014-04-02 LAB — T4, FREE: Free T4: 1.48 ng/dL (ref 0.80–1.80)

## 2014-04-02 LAB — COMPLETE METABOLIC PANEL WITH GFR
ALT: 26 U/L (ref 0–35)
AST: 23 U/L (ref 0–37)
Albumin: 4.2 g/dL (ref 3.5–5.2)
Alkaline Phosphatase: 66 U/L (ref 39–117)
BUN: 21 mg/dL (ref 6–23)
CO2: 23 mEq/L (ref 19–32)
Calcium: 9.3 mg/dL (ref 8.4–10.5)
Chloride: 107 mEq/L (ref 96–112)
Creat: 0.56 mg/dL (ref 0.50–1.10)
GFR, Est African American: >89 mL/min
GFR, Est Non African American: >89 mL/min
Glucose, Bld: 91 mg/dL (ref 70–99)
Potassium: 3.9 mEq/L (ref 3.5–5.3)
Sodium: 140 mEq/L (ref 135–145)
Total Bilirubin: 0.5 mg/dL (ref 0.2–1.2)
Total Protein: 6.6 g/dL (ref 6.0–8.3)

## 2014-04-02 LAB — POCT UA - MICROSCOPIC ONLY
Bacteria, U Microscopic: NEGATIVE
Casts, Ur, LPF, POC: NEGATIVE
Crystals, Ur, HPF, POC: NEGATIVE
Mucus, UA: NEGATIVE
Yeast, UA: NEGATIVE

## 2014-04-02 LAB — POCT URINALYSIS DIPSTICK
Bilirubin, UA: NEGATIVE
Glucose, UA: NEGATIVE
Ketones, UA: NEGATIVE
Nitrite, UA: NEGATIVE
Protein, UA: NEGATIVE
Spec Grav, UA: 1.015
Urobilinogen, UA: 0.2
pH, UA: 5

## 2014-04-02 LAB — LIPID PANEL
Cholesterol: 179 mg/dL (ref 0–200)
HDL: 58 mg/dL (ref >39)
LDL Cholesterol: 106 mg/dL — ABNORMAL HIGH (ref 0–99)
Total CHOL/HDL Ratio: 3.1 Ratio
Triglycerides: 77 mg/dL (ref <150)
VLDL: 15 mg/dL (ref 0–40)

## 2014-04-02 LAB — CBC WITH DIFFERENTIAL/PLATELET
Basophils Absolute: 0.1 10*3/uL (ref 0.0–0.1)
Basophils Relative: 1 % (ref 0–1)
Eosinophils Absolute: 0.2 10*3/uL (ref 0.0–0.7)
Eosinophils Relative: 3 % (ref 0–5)
HCT: 41.6 % (ref 36.0–46.0)
Hemoglobin: 14.7 g/dL (ref 12.0–15.0)
Lymphocytes Relative: 35 % (ref 12–46)
Lymphs Abs: 1.9 10*3/uL (ref 0.7–4.0)
MCH: 32.7 pg (ref 26.0–34.0)
MCHC: 35.3 g/dL (ref 30.0–36.0)
MCV: 92.4 fL (ref 78.0–100.0)
MPV: 10.5 fL (ref 8.6–12.4)
Monocytes Absolute: 0.4 10*3/uL (ref 0.1–1.0)
Monocytes Relative: 7 % (ref 3–12)
Neutro Abs: 2.9 10*3/uL (ref 1.7–7.7)
Neutrophils Relative %: 54 % (ref 43–77)
Platelets: 253 10*3/uL (ref 150–400)
RBC: 4.5 MIL/uL (ref 3.87–5.11)
RDW: 13.9 % (ref 11.5–15.5)
WBC: 5.3 10*3/uL (ref 4.0–10.5)

## 2014-04-02 LAB — TSH: TSH: 0.827 u[IU]/mL (ref 0.350–4.500)

## 2014-04-02 MED ORDER — CYCLOBENZAPRINE HCL 10 MG PO TABS: 10.0000 mg | ORAL_TABLET | Freq: Every day | ORAL | Status: DC

## 2014-04-02 MED ORDER — ZOSTER VACCINE LIVE 19400 UNT/0.65ML ~~LOC~~ SOLR: 0.6500 mL | Freq: Once | SUBCUTANEOUS | Status: DC

## 2014-04-02 MED ORDER — EFINACONAZOLE 10 % EX SOLN: 1.0000 "application " | Freq: Every day | CUTANEOUS | Status: DC

## 2014-04-02 MED ORDER — SYNTHROID 100 MCG PO TABS: 100.0000 ug | ORAL_TABLET | Freq: Every day | ORAL | Status: DC

## 2014-04-02 MED ORDER — ZOSTER VACCINE LIVE 19400 UNT/0.65ML ~~LOC~~ SOLR
0.6500 mL | Freq: Once | SUBCUTANEOUS | Status: DC
Start: 2014-04-02 — End: 2015-02-09

## 2014-04-02 NOTE — Progress Notes (Signed)
Subjective:    Patient ID: Wanda Hernandez, female    DOB: 15-May-1940, 74 y.o.   MRN: [id number]  HPIpe Patient Active Problem List   Diagnosis Date Noted  . Detached retina 11/16/2011  . GERD (gastroesophageal reflux disease) 11/16/2011  . Migraine   . Osteopenia   . Hypothyroid    all medical problems are stable and asymptomatic Prior to Admission medications   Medication Sig Start Date End Date Taking? Authorizing Provider  b complex vitamins tablet Take 1 tablet by mouth daily.   Yes Historical Provider, MD  lansoprazole (PREVACID) 30 MG capsule Take 1 capsule (30 mg total) by mouth daily. 05/24/12  Yes Mancel Bale, PA-C  Multiple Vitamin (MULTIVITAMIN) tablet Take 1 tablet by mouth daily.   Yes Historical Provider, MD  nitrofurantoin, macrocrystal-monohydrate, (MACROBID) 100 MG capsule Take 1 capsule (100 mg total) by mouth 2 (two) times daily. 09/11/13  Yes Roselee Culver, MD  SYNTHROID 100 MCG tablet Take 1 tablet (100 mcg total) by mouth daily. Name brand medically necessary 04/25/13  Yes Mancel Bale, PA-C    Cold/ST since xmas- no fever chills or night sweats-persistent nonproductive cough without nasal congestion BP 119-135/80-90s at home//the only elevated blood pressures when she is feeling bad with a headache or when she has an illness Daughter Juliann Pulse PA head and Neck surgery in [place] See UTIs--one in fall also rx cranberry and fluids--ok since colonos #2 2005 Dr Fuller Plan one polyp--hated prep--chooses hemosure Declines flu Accepts prevnar Dec;lines dexa--hated fossamax 10y ago and will use Ca and exercise only Accepts zostavax Mammography 13 months ago within normal limits At no risk for falls Not in need of diet information No chronic illnesses Nonsmoker  Review of Systems  14 point review of systems by form all negative except for recent cough cold and for nonresolution of onychomycosis on one toe following medical therapy for 12 weeks PH Q9 negative for  depression is negative      Objective:   Physical Exam  Constitutional: She is oriented to person, place, and time. She appears well-developed and well-nourished. No distress.  HENT:  Head: Normocephalic.  Right Ear: External ear normal.  Left Ear: External ear normal.  Nose: Nose normal.  Mouth/Throat: Oropharynx is clear and moist.  Eyes: Conjunctivae and EOM are normal. Pupils are equal, round, and reactive to light.  Neck: Normal range of motion. Neck supple. No thyromegaly present.  Cardiovascular: Normal rate, regular rhythm, normal heart sounds and intact distal pulses.   No murmur heard. Pulmonary/Chest: Effort normal and breath sounds normal. She has no wheezes.  Breast exam normal bilaterally  Abdominal: Soft. Bowel sounds are normal. She exhibits no distension and no mass. There is no tenderness. There is no rebound.  Musculoskeletal: Normal range of motion. She exhibits no edema or tenderness.  Lymphadenopathy:    She has no cervical adenopathy.  Neurological: She is alert and oriented to person, place, and time. She has normal reflexes. No cranial nerve deficit.  Skin: Skin is warm and dry. No rash noted.  onychomy R 3rd toe/whole nail  Psychiatric: She has a normal mood and affect. Her behavior is normal. Judgment and thought content normal.  Nursing note and vitals reviewed.  BP 151/92 mmHg  Pulse 72  Temp(Src) 97.6 F (36.4 C)  Resp 16  Ht 5\' 5"  (1.651 m)  Wt 135 lb (61.236 kg)  BMI 22.47 kg/m2  SpO2 98% Note history of home blood pressures-she will continue to monitor  Assessment & Plan:  Annual physical exam  Onychomycosis - Plan: Efinaconazole 10 % SOLN  Recurrent UTI  Cough  Hypothyroidism, unspecified hypothyroidism type  Occasional lumbar spasm relieved by bedtime Flexeril   Medicare subsequent wellness evaluation   Zostavax/ Prevnar  Meds ordered this encounter  Medications  . Efinaconazole 10 % SOLN    Sig: Apply 1  application topically daily. For 48 weeks    Dispense:  8 mL    Refill:  4  . cyclobenzaprine (FLEXERIL) 10 MG tablet    Sig: Take 1 tablet (10 mg total) by mouth at bedtime. prn    Dispense:  30 tablet    Refill:  3  . SYNTHROID 100 MCG tablet    Sig: Take 1 tablet (100 mcg total) by mouth daily. Name brand medically necessary    Dispense:  90 tablet    Refill:  3    DISPENSE NAME BRAND ONLY. NAME BRAND IS MEDICALLY NECESSARY.  Marland Kitchen zoster vaccine live, PF, (ZOSTAVAX) 38101 UNT/0.65ML injection    Sig: Inject 19,400 Units into the skin once. Administer at pharmacy    Dispense:  1 each    Refill:  0

## 2014-04-04 ENCOUNTER — Encounter: Payer: Self-pay | Admitting: Internal Medicine

## 2014-04-06 ENCOUNTER — Other Ambulatory Visit: Payer: Self-pay | Admitting: Physician Assistant

## 2014-05-20 ENCOUNTER — Ambulatory Visit (INDEPENDENT_AMBULATORY_CARE_PROVIDER_SITE_OTHER): Payer: Medicare HMO | Admitting: Family Medicine

## 2014-05-20 VITALS — BP 132/84 | HR 74 | Temp 97.7°F | Resp 16 | Ht 65.0 in | Wt 136.0 lb

## 2014-05-20 DIAGNOSIS — N3090 Cystitis, unspecified without hematuria: Secondary | ICD-10-CM

## 2014-05-20 DIAGNOSIS — R300 Dysuria: Secondary | ICD-10-CM

## 2014-05-20 DIAGNOSIS — R110 Nausea: Secondary | ICD-10-CM

## 2014-05-20 LAB — POCT UA - MICROSCOPIC ONLY
Casts, Ur, LPF, POC: NEGATIVE
Crystals, Ur, HPF, POC: NEGATIVE
Mucus, UA: NEGATIVE
Yeast, UA: NEGATIVE

## 2014-05-20 LAB — POCT URINALYSIS DIPSTICK
Bilirubin, UA: NEGATIVE
Glucose, UA: 100
Ketones, UA: NEGATIVE
Nitrite, UA: POSITIVE
Spec Grav, UA: <=1.005
Urobilinogen, UA: 1
pH, UA: 5

## 2014-05-20 MED ORDER — PHENAZOPYRIDINE HCL 200 MG PO TABS: 200.0000 mg | ORAL_TABLET | Freq: Three times a day (TID) | ORAL | Status: DC | PRN

## 2014-05-20 MED ORDER — CIPROFLOXACIN HCL 250 MG PO TABS: 250.0000 mg | ORAL_TABLET | Freq: Two times a day (BID) | ORAL | Status: DC

## 2014-05-20 NOTE — Patient Instructions (Addendum)
Drink plenty of fluids  Take the Pyridium 3 times daily as needed for urinary pain  Take the ciprofloxacin one twice daily for 7 days  Drink extra water at the time of intercourse as discussed. Also consider taking vitamin C or cranberry at that time to acidify the urine.  If recurrent urinary infections continue, you might be a candidate for a prophylactic antibiotic at the time of intercourse.

## 2014-05-20 NOTE — Progress Notes (Signed)
Subjective: 74 year old lady who is here with urinary symptoms since yesterday. Night before last they have had intercourse, and she recognizes that that may be causing some of this. She has had a couple of UTIs in the past year. Apparently she's had to be retreated at times in the past.  Objective: Pleasant no other symptoms dysuria. No CVA tenderness. Abdomen soft without mass or tenderness.  Results for orders placed or performed in visit on 05/20/14  POCT urinalysis dipstick  Result Value Ref Range   Color, UA orange    Clarity, UA cloudy    Glucose, UA 100    Bilirubin, UA negative    Ketones, UA negative    Spec Grav, UA <=1.005    Blood, UA moderate    pH, UA 5.0    Protein, UA trace    Urobilinogen, UA 1.0    Nitrite, UA positive    Leukocytes, UA large (3+)    Assessment: Cystitis  Plan: Ciprofloxacin Peridium See instructions Urine culture

## 2014-05-22 LAB — URINE CULTURE
Colony Count: NO GROWTH
Organism ID, Bacteria: NO GROWTH

## 2014-06-04 ENCOUNTER — Encounter: Payer: Self-pay | Admitting: Gastroenterology

## 2014-06-19 ENCOUNTER — Encounter: Payer: Self-pay | Admitting: Gastroenterology

## 2014-07-03 ENCOUNTER — Ambulatory Visit: Payer: Medicare Other | Admitting: Gastroenterology

## 2015-02-09 ENCOUNTER — Ambulatory Visit (INDEPENDENT_AMBULATORY_CARE_PROVIDER_SITE_OTHER): Payer: Medicare HMO | Admitting: Internal Medicine

## 2015-02-09 VITALS — BP 102/70 | HR 86 | Temp 98.1°F | Resp 12 | Ht 65.0 in | Wt 134.8 lb

## 2015-02-09 DIAGNOSIS — R6883 Chills (without fever): Secondary | ICD-10-CM | POA: Diagnosis not present

## 2015-02-09 DIAGNOSIS — N390 Urinary tract infection, site not specified: Secondary | ICD-10-CM

## 2015-02-09 DIAGNOSIS — K121 Other forms of stomatitis: Secondary | ICD-10-CM

## 2015-02-09 DIAGNOSIS — R8281 Pyuria: Secondary | ICD-10-CM

## 2015-02-09 DIAGNOSIS — R300 Dysuria: Secondary | ICD-10-CM | POA: Diagnosis not present

## 2015-02-09 LAB — POCT URINALYSIS DIP (MANUAL ENTRY)
Bilirubin, UA: NEGATIVE
Glucose, UA: NEGATIVE
Ketones, POC UA: NEGATIVE
Leukocytes, UA: NEGATIVE
Nitrite, UA: NEGATIVE
Protein Ur, POC: NEGATIVE
Spec Grav, UA: 1.01
Urobilinogen, UA: 0.2
pH, UA: 5.5

## 2015-02-09 LAB — POC MICROSCOPIC URINALYSIS (UMFC): Mucus: ABSENT

## 2015-02-09 MED ORDER — CIPROFLOXACIN HCL 250 MG PO TABS: 250.0000 mg | ORAL_TABLET | Freq: Two times a day (BID) | ORAL | Status: DC

## 2015-02-09 NOTE — Progress Notes (Signed)
Subjective:  This chart was scribed for Tami Lin, MD by Thea Alken, ED Scribe. This patient was seen in room 12 and the patient's care was started at 5:42 PM.   Patient ID: Wanda Hernandez, female    DOB: [birth year], 74 y.o.   MRN: [id number]  HPI   Chief Complaint  Patient presents with  . Fever    unspecified  . Dysuria  . Back Pain  . Sore Throat  . Generalized Body Aches   HPI Comments: Wanda Hernandez is a 74 y.o. female who presents to the [facility] complaining of dysuria. Pt had dysuria yesterday but states she was not drinking enough water so she increased her water intake.She woke up this morning with worsening symptoms including lower back pain, positional abdominal pain, HA, sore throat, and subjective fever. No cough, constipation, and diarrhea. .   04/2014- She had positive urine but negative culture. Has had freq utis in last 37mos tho none since feb when she took Dr Lemmie Evens adv re fluids--just stopped this 1 mo ago.  Also c/o recurrent ulcers tongue and buccal 3-5 yrs-worse with stress and better with B12/lysine Worries she is B deficient. Admits stress=anxiety--see past- has worries re kids and underperforming.  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 Migraine   . Osteopenia   . History of detached retina repair   . Colon polyp   . Hypothyroid   . Onychomycosis    Allergies  Allergen Reactions  . Sulfa Antibiotics   . Codeine    Prior to Admission medications   Medication Sig Start Date End Date Taking? Authorizing Provider  b complex vitamins tablet Take 1 tablet by mouth daily.   Yes Historical Provider, MD  Cetirizine HCl (ZYRTEC ALLERGY PO) Take by mouth.   Yes Historical Provider, MD  CRANBERRY PO Take by mouth.   Yes Historical Provider, MD  IBUPROFEN PO Take by mouth.   Yes Historical Provider, MD    lansoprazole (PREVACID) 30 MG capsule Take 1 capsule (30 mg total) by mouth daily. 05/24/12  Yes Mancel Bale, PA-C  Multiple Vitamin (MULTIVITAMIN) tablet Take 1 tablet by mouth daily.   Yes Historical Provider, MD  SYNTHROID 100 MCG tablet Take 1 tablet (100 mcg total) by mouth daily. Name brand medically necessary 04/02/14  Yes Leandrew Koyanagi, MD  ciprofloxacin (CIPRO) 250 MG tablet Take 1 tablet (250 mg total) by mouth 2 (two) times daily. Patient not taking: Reported on 02/09/2015 05/20/14   Posey Boyer, MD  cyclobenzaprine (FLEXERIL) 10 MG tablet Take 1 tablet (10 mg total) by mouth at bedtime. prn Patient not taking: Reported on 05/20/2014 04/02/14   Leandrew Koyanagi, MD  Efinaconazole 10 % SOLN Apply 1 application topically daily. For 48 weeks Patient not taking: Reported on 05/20/2014 04/02/14   Leandrew Koyanagi, MD  phenazopyridine (PYRIDIUM) 200 MG tablet Take 1 tablet (200 mg total) by mouth 3 (three) times daily as needed for pain. Patient not taking: Reported on 02/09/2015 05/20/14   Posey Boyer, MD  zoster vaccine live, PF, (ZOSTAVAX) 60454 UNT/0.65ML injection Inject 19,400 Units into the skin once. Administer at pharmacy Patient not taking: Reported on 05/20/2014 04/02/14   Leandrew Koyanagi, MD   Review of Systems  Constitutional: Positive for fever and chills. Negative for activity change, appetite change and unexpected weight change.  HENT: Positive for  sore throat. Negative for postnasal drip.   Respiratory: Negative for cough, shortness of breath and wheezing.   Cardiovascular: Negative for chest pain, palpitations and leg swelling.  Gastrointestinal: Positive for abdominal pain. Negative for diarrhea and constipation.  Musculoskeletal: Positive for myalgias and back pain.  Neurological: Positive for headaches. Negative for light-headedness.  Psychiatric/Behavioral: Negative for behavioral problems and sleep disturbance. The patient is nervous/anxious.     Objective:    Physical Exam  Constitutional: She is oriented to person, place, and time. She appears well-developed and well-nourished. No distress.  HENT:  Mouth/Throat: Oropharynx is clear and moist.  Eyes: EOM are normal. Pupils are equal, round, and reactive to light.  Neck: No thyromegaly present.  Cardiovascular: Normal rate, regular rhythm and normal heart sounds.   Abdominal: She exhibits no distension. There is no tenderness.  Musculoskeletal: Normal range of motion. She exhibits no edema.  Lymphadenopathy:    She has no cervical adenopathy.  Neurological: She is alert and oriented to person, place, and time. No cranial nerve deficit.  Psychiatric: Her behavior is normal. Judgment and thought content normal.  anxious    Filed Vitals:   02/09/15 1617  BP: 102/70  Pulse: 86  Temp: 98.1 F (36.7 C)  TempSrc: Oral  Resp: 12  Height: 5\' 5"  (1.651 m)  Weight: 134 lb 12.8 oz (61.145 kg)  SpO2: 96%    Results for orders placed or performed in visit on 02/09/15  POCT Microscopic Urinalysis ([facility])  Result Value Ref Range   WBC,UR,HPF,POC Moderate (A) None WBC/hpf   RBC,UR,HPF,POC Few (A) None RBC/hpf   Bacteria Few (A) None, Too numerous to count   Mucus Absent Absent   Epithelial Cells, UR Per Microscopy Few (A) None, Too numerous to count cells/hpf  POCT urinalysis dipstick  Result Value Ref Range   Color, UA yellow yellow   Clarity, UA hazy (A) clear   Glucose, UA negative negative   Bilirubin, UA negative negative   Ketones, POC UA negative negative   Spec Grav, UA 1.010    Blood, UA moderate (A) negative   pH, UA 5.5    Protein Ur, POC negative negative   Urobilinogen, UA 0.2    Nitrite, UA Negative Negative   Leukocytes, UA Negative Negative    Assessment & Plan:   1. Dysuria   2. Chills   3. Pyuria   4. Mouth ulcer --disc cult when active/stress reduc/contin supplments    Orders Placed This Encounter  Procedures  . Urine culture  . CBC with  Differential/Platelet  . Comprehensive metabolic panel  . TSH  . Folate  . Vitamin B12  . POCT Microscopic Urinalysis ([facility])  . POCT urinalysis dipstick    Meds ordered this encounter  Medications  . CRANBERRY PO    Sig: Take by mouth.  . IBUPROFEN PO    Sig: Take by mouth.  . Cetirizine HCl (ZYRTEC ALLERGY PO)    Sig: Take by mouth.  . ciprofloxacin (CIPRO) 250 MG tablet    Sig: Take 1 tablet (250 mg total) by mouth 2 (two) times daily.    Dispense:  20 tablet    Refill:  0  has tol this well All sulfa  I have completed the patient encounter in its entirety as documented by the scribe, with editing by me where necessary. Beatryce Colombo P. Laney Pastor, M.D.

## 2015-02-10 ENCOUNTER — Encounter: Payer: Self-pay | Admitting: Internal Medicine

## 2015-02-10 LAB — COMPREHENSIVE METABOLIC PANEL
ALT: 26 U/L (ref 6–29)
AST: 24 U/L (ref 10–35)
Albumin: 4.5 g/dL (ref 3.6–5.1)
Alkaline Phosphatase: 57 U/L (ref 33–130)
BUN: 21 mg/dL (ref 7–25)
CO2: 24 mmol/L (ref 20–31)
Calcium: 9.9 mg/dL (ref 8.6–10.4)
Chloride: 106 mmol/L (ref 98–110)
Creat: 0.63 mg/dL (ref 0.60–0.93)
Glucose, Bld: 99 mg/dL (ref 65–99)
Potassium: 4.1 mmol/L (ref 3.5–5.3)
Sodium: 139 mmol/L (ref 135–146)
Total Bilirubin: 1.1 mg/dL (ref 0.2–1.2)
Total Protein: 6.9 g/dL (ref 6.1–8.1)

## 2015-02-10 LAB — CBC WITH DIFFERENTIAL/PLATELET
Basophils Absolute: 0 10*3/uL (ref 0.0–0.1)
Basophils Relative: 0 % (ref 0–1)
Eosinophils Absolute: 0.1 10*3/uL (ref 0.0–0.7)
Eosinophils Relative: 1 % (ref 0–5)
HCT: 42.9 % (ref 36.0–46.0)
Hemoglobin: 15.2 g/dL — ABNORMAL HIGH (ref 12.0–15.0)
Lymphocytes Relative: 24 % (ref 12–46)
Lymphs Abs: 1.8 10*3/uL (ref 0.7–4.0)
MCH: 32.2 pg (ref 26.0–34.0)
MCHC: 35.4 g/dL (ref 30.0–36.0)
MCV: 90.9 fL (ref 78.0–100.0)
MPV: 10.6 fL (ref 8.6–12.4)
Monocytes Absolute: 0.6 10*3/uL (ref 0.1–1.0)
Monocytes Relative: 8 % (ref 3–12)
Neutro Abs: 5 10*3/uL (ref 1.7–7.7)
Neutrophils Relative %: 67 % (ref 43–77)
Platelets: 202 10*3/uL (ref 150–400)
RBC: 4.72 MIL/uL (ref 3.87–5.11)
RDW: 13.1 % (ref 11.5–15.5)
WBC: 7.4 10*3/uL (ref 4.0–10.5)

## 2015-02-10 LAB — TSH: TSH: 0.591 u[IU]/mL (ref 0.350–4.500)

## 2015-02-10 LAB — FOLATE: Folate: >20 ng/mL

## 2015-02-10 LAB — VITAMIN B12: Vitamin B-12: >2000 pg/mL — ABNORMAL HIGH (ref 211–911)

## 2015-02-12 LAB — URINE CULTURE: Colony Count: >=100000

## 2015-02-13 NOTE — Addendum Note (Signed)
Addended by: Constance Goltz on: 02/13/2015 11:44 AM   Modules accepted: Orders

## 2015-02-23 ENCOUNTER — Encounter: Payer: Self-pay | Admitting: Internal Medicine

## 2015-02-28 ENCOUNTER — Telehealth: Payer: Self-pay

## 2015-02-28 NOTE — Telephone Encounter (Signed)
Patient is calling to let us know that she is switching insurance to [employer]. The insurance company will call us next week to ask [place] what she's been treated for in the last two years and her current medication. Patient states that the only medication she currently takes is synthroid.

## 2015-03-02 NOTE — Telephone Encounter (Signed)
Ok noted  

## 2015-04-07 ENCOUNTER — Other Ambulatory Visit: Payer: Self-pay | Admitting: Internal Medicine

## 2015-04-08 ENCOUNTER — Encounter: Payer: Medicare Other | Admitting: Internal Medicine

## 2015-04-17 ENCOUNTER — Other Ambulatory Visit (INDEPENDENT_AMBULATORY_CARE_PROVIDER_SITE_OTHER): Payer: Medicare Other | Admitting: Internal Medicine

## 2015-04-17 DIAGNOSIS — K219 Gastro-esophageal reflux disease without esophagitis: Secondary | ICD-10-CM | POA: Diagnosis not present

## 2015-04-17 LAB — FECAL OCCULT BLOOD, GUAIAC: Fecal Occult Blood: NEGATIVE

## 2015-04-17 LAB — IFOBT (OCCULT BLOOD): IFOBT: NEGATIVE

## 2015-04-21 ENCOUNTER — Encounter: Payer: Self-pay | Admitting: Family Medicine

## 2015-04-29 ENCOUNTER — Encounter: Payer: Self-pay | Admitting: Internal Medicine

## 2015-04-29 ENCOUNTER — Ambulatory Visit (INDEPENDENT_AMBULATORY_CARE_PROVIDER_SITE_OTHER): Payer: Medicare Other | Admitting: Internal Medicine

## 2015-04-29 VITALS — BP 127/84 | HR 79 | Temp 98.3°F | Resp 16 | Ht 65.0 in | Wt 136.0 lb

## 2015-04-29 DIAGNOSIS — Z0000 Encounter for general adult medical examination without abnormal findings: Secondary | ICD-10-CM | POA: Diagnosis not present

## 2015-04-29 DIAGNOSIS — E039 Hypothyroidism, unspecified: Secondary | ICD-10-CM

## 2015-04-29 DIAGNOSIS — M8580 Other specified disorders of bone density and structure, unspecified site: Secondary | ICD-10-CM

## 2015-04-29 DIAGNOSIS — E785 Hyperlipidemia, unspecified: Secondary | ICD-10-CM

## 2015-04-29 DIAGNOSIS — Z780 Asymptomatic menopausal state: Secondary | ICD-10-CM

## 2015-04-29 DIAGNOSIS — N390 Urinary tract infection, site not specified: Secondary | ICD-10-CM | POA: Diagnosis not present

## 2015-04-29 LAB — POCT URINALYSIS DIP (MANUAL ENTRY)
Bilirubin, UA: NEGATIVE
Glucose, UA: NEGATIVE
Ketones, POC UA: NEGATIVE
Leukocytes, UA: NEGATIVE
Nitrite, UA: NEGATIVE
Protein Ur, POC: NEGATIVE
Spec Grav, UA: <=1.005
Urobilinogen, UA: 0.2
pH, UA: 5.5

## 2015-04-29 LAB — TSH: TSH: 0.134 u[IU]/mL — ABNORMAL LOW (ref 0.350–4.500)

## 2015-04-29 LAB — POC MICROSCOPIC URINALYSIS (UMFC): Mucus: ABSENT

## 2015-04-29 LAB — LIPID PANEL
Cholesterol: 182 mg/dL (ref 125–200)
HDL: 69 mg/dL (ref >=46)
LDL Cholesterol: 98 mg/dL (ref <130)
Total CHOL/HDL Ratio: 2.6 Ratio (ref <=5.0)
Triglycerides: 76 mg/dL (ref <150)
VLDL: 15 mg/dL (ref <30)

## 2015-04-29 MED ORDER — CYCLOBENZAPRINE HCL 10 MG PO TABS: 10.0000 mg | ORAL_TABLET | Freq: Every day | ORAL | Status: DC

## 2015-04-29 MED ORDER — VALACYCLOVIR HCL 1 G PO TABS: ORAL_TABLET | ORAL | Status: DC

## 2015-04-29 MED ORDER — ZOSTER VACCINE LIVE 19400 UNT/0.65ML ~~LOC~~ SOLR: 0.6500 mL | Freq: Once | SUBCUTANEOUS | Status: DC

## 2015-04-29 NOTE — Progress Notes (Signed)
Subjective:    Patient ID: Wanda Hernandez, female    DOB: 03/05/1941, 75 y.o.   MRN: [id number]  [facility] wellness exam subsequent encounter Patient Active Problem List   Diagnosis Date Noted  . Detached retina--stable 11/16/2011  . GERD (gastroesophageal reflux disease)---stable without meds except when necessary 11/16/2011  . Migraine--no problems recently   . Osteopenia--Last DEXA several years ago   . Hypothyroid--recent TSH normal   She has had recurrent lip and tongue ulcers over the past 6 months Lots of stress with regard to her husbands need for surgery for prostate cancer-- Note discussion of stress in December visit  She prefers to avoid further mammograms Other health maintenance issues up-to-date except colonoscopy and she elects yearly FOBT Zostavax was too expensive  Functional status intact without depression or risk for falls Very active from an intellectual standpoint  Review of Systems 14 point review of systems otherwise negative// At last visit we entertained recurrent urinary tract infections plus or minus the presence of hematuria and this needs follow-up Although asymptomatic she has a history of osteopenia and is postmenopausal See history of elevated cholesterol marginal    Objective:   Physical Exam  Constitutional: She is oriented to person, place, and time. She appears well-developed and well-nourished. No distress.  HENT:  Head: Normocephalic.  Right Ear: External ear normal.  Left Ear: External ear normal.  Nose: Nose normal.  Mouth/Throat: Oropharynx is clear and moist.  Eyes: Conjunctivae and EOM are normal. Pupils are equal, round, and reactive to light.  Neck: Normal range of motion. Neck supple. No thyromegaly present.  Cardiovascular: Normal rate, regular rhythm, normal heart sounds and intact distal pulses.   No murmur heard. Pulmonary/Chest: Effort normal and breath sounds normal. She has no wheezes.  Breasts symmetrical  without masses  Abdominal: Soft. Bowel sounds are normal. She exhibits no distension and no mass. There is no tenderness. There is no rebound.  Musculoskeletal: Normal range of motion. She exhibits no edema or tenderness.  Lymphadenopathy:    She has no cervical adenopathy.  Neurological: She is alert and oriented to person, place, and time. She has normal reflexes. No cranial nerve deficit.  Skin: Skin is warm and dry. No rash noted.  There is a 0.5 cm ulcer on the tip of the tongue  Psychiatric: She has a normal mood and affect. Her behavior is normal. Judgment and thought content normal.  Nursing note and vitals reviewed. BP 127/84 mmHg  Pulse 79  Temp(Src) 98.3 F (36.8 C)  Resp 16  Ht 5\' 5"  (1.651 m)  Wt 136 lb (61.689 kg)  BMI 22.63 kg/m2      Assessment & Plan:  Medicare annual wellness visit, subsequent  Osteopenia - Plan: DG Bone Density  Post-menopause - Plan: DG Bone Density  Recurrent UTI - Plan: POCT urinalysis dipstick, POCT Microscopic Urinalysis ([facility]), Urine culture  Hyperlipidemia - Plan: Lipid panel  Hypothyroidism, unspecified hypothyroidism type - Plan: TSH  Meds ordered this encounter  Medications  . zoster vaccine live, PF, (ZOSTAVAX) 09811 UNT/0.65ML injection    Sig: Inject 19,400 Units into the skin once. Administer at pharmacy    Dispense:  1 each    Refill:  0  . cyclobenzaprine (FLEXERIL) 10 MG tablet--- she uses a half a tablet occasionally when she has muscle tightness or spasm after exercise and she intends to begin daily workouts at the [facility]     Sig: Take 1 tablet (10 mg total) by mouth at bedtime.  Dispense:  30 tablet    Refill:  2  . valACYclovir (VALTREX) 1000 MG tablet--trial to see if this decreases length of illness     Sig: 1 at onset of ulcer and 1 12 hrs later    Dispense:  6 tablet    Refill:  1   DEXA scan ordered ?proton rx [facility] for [facility]by  Options for follow-up given

## 2015-05-01 LAB — URINE CULTURE: Colony Count: 9000

## 2015-05-04 ENCOUNTER — Encounter: Payer: Self-pay | Admitting: Internal Medicine

## 2015-05-18 ENCOUNTER — Telehealth: Payer: Self-pay

## 2015-05-18 MED ORDER — VALACYCLOVIR HCL 1 G PO TABS: 1000.0000 mg | ORAL_TABLET | Freq: Every day | ORAL | Status: DC

## 2015-05-18 NOTE — Telephone Encounter (Signed)
Pt states she is continually getting mouth sores and is taking her meds but wants a med that she can take to prevent the outbreak from happening   Best phone for pt is Wanda Hernandez

## 2015-05-26 DIAGNOSIS — H2511 Age-related nuclear cataract, right eye: Secondary | ICD-10-CM | POA: Diagnosis not present

## 2015-05-26 DIAGNOSIS — H338 Other retinal detachments: Secondary | ICD-10-CM | POA: Diagnosis not present

## 2015-05-28 ENCOUNTER — Other Ambulatory Visit: Payer: Self-pay

## 2015-05-28 DIAGNOSIS — E2839 Other primary ovarian failure: Secondary | ICD-10-CM

## 2015-06-19 ENCOUNTER — Encounter: Payer: Self-pay | Admitting: Family Medicine

## 2015-06-25 ENCOUNTER — Ambulatory Visit
Admission: RE | Admit: 2015-06-25 | Discharge: 2015-06-25 | Disposition: A | Discharge status: Home / Self Care | Payer: Medicare Other | Source: Ambulatory Visit | Attending: Internal Medicine | Admitting: Internal Medicine

## 2015-06-25 ENCOUNTER — Encounter: Payer: Self-pay | Admitting: Internal Medicine

## 2015-06-25 DIAGNOSIS — Z780 Asymptomatic menopausal state: Secondary | ICD-10-CM | POA: Diagnosis not present

## 2015-06-25 DIAGNOSIS — E2839 Other primary ovarian failure: Secondary | ICD-10-CM

## 2015-06-25 DIAGNOSIS — M810 Age-related osteoporosis without current pathological fracture: Secondary | ICD-10-CM | POA: Diagnosis not present

## 2015-07-10 ENCOUNTER — Other Ambulatory Visit: Payer: Self-pay | Admitting: Internal Medicine

## 2015-07-10 NOTE — Telephone Encounter (Signed)
Is her synthroid dose remaining the same?  How many refills?

## 2015-09-17 DIAGNOSIS — H31011 Macula scars of posterior pole (postinflammatory) (post-traumatic), right eye: Secondary | ICD-10-CM | POA: Diagnosis not present

## 2015-09-17 DIAGNOSIS — H33052 Total retinal detachment, left eye: Secondary | ICD-10-CM | POA: Diagnosis not present

## 2015-09-17 DIAGNOSIS — H25011 Cortical age-related cataract, right eye: Secondary | ICD-10-CM | POA: Diagnosis not present

## 2015-09-17 DIAGNOSIS — H25041 Posterior subcapsular polar age-related cataract, right eye: Secondary | ICD-10-CM | POA: Diagnosis not present

## 2015-09-17 DIAGNOSIS — H31012 Macula scars of posterior pole (postinflammatory) (post-traumatic), left eye: Secondary | ICD-10-CM | POA: Diagnosis not present

## 2015-09-17 DIAGNOSIS — H2511 Age-related nuclear cataract, right eye: Secondary | ICD-10-CM | POA: Diagnosis not present

## 2015-10-15 ENCOUNTER — Telehealth: Payer: Self-pay

## 2015-10-15 NOTE — Telephone Encounter (Signed)
Pt is needing to talk with someone about getting a referral to an ENT office she was given a name and did not like the provider and would like the name of someone else   Best number [phone]

## 2015-10-16 ENCOUNTER — Telehealth: Payer: Self-pay

## 2015-10-16 MED ORDER — SYNTHROID 100 MCG PO TABS
100.0000 ug | ORAL_TABLET | Freq: Every day | ORAL | Status: DC
Start: 2015-10-16 — End: 2016-01-15

## 2015-10-16 NOTE — Telephone Encounter (Signed)
Last TSH was low, and needs to be repeated. Rx sent, but patient needs re-evaluation. Please advise patient of new same day appointment system and advise of need for follow-up.  Meds ordered this encounter  Medications  . SYNTHROID 100 MCG tablet    Sig: Take 1 tablet (100 mcg total) by mouth daily.    Dispense:  90 tablet    Refill:  0    Order Specific Question:  Supervising Provider    Answer:  Brigitte Pulse, EVA N [[id number]]

## 2015-10-16 NOTE — Telephone Encounter (Signed)
Called, unable to leave voicemail.

## 2015-10-16 NOTE — Telephone Encounter (Signed)
Synthroid 154mcg tabs #90-fax req-[facility]-sent to [place] pool

## 2015-10-20 DIAGNOSIS — H2511 Age-related nuclear cataract, right eye: Secondary | ICD-10-CM | POA: Diagnosis not present

## 2015-10-20 DIAGNOSIS — H25811 Combined forms of age-related cataract, right eye: Secondary | ICD-10-CM | POA: Diagnosis not present

## 2015-10-20 DIAGNOSIS — H25011 Cortical age-related cataract, right eye: Secondary | ICD-10-CM | POA: Diagnosis not present

## 2015-10-20 NOTE — Telephone Encounter (Signed)
Called pt but mailbox is full.

## 2015-10-22 NOTE — Telephone Encounter (Signed)
Tried to reach pt again. VM still full

## 2015-10-23 NOTE — Telephone Encounter (Signed)
Called pt's husband and gave him the message for the pt. Also asked him to tell her to call first to get sch and see when she can sch a same day appt since we are no longer doing walk ins.

## 2015-11-11 DIAGNOSIS — R300 Dysuria: Secondary | ICD-10-CM | POA: Diagnosis not present

## 2015-11-11 DIAGNOSIS — N3000 Acute cystitis without hematuria: Secondary | ICD-10-CM | POA: Diagnosis not present

## 2015-11-11 DIAGNOSIS — N3001 Acute cystitis with hematuria: Secondary | ICD-10-CM | POA: Diagnosis not present

## 2015-12-13 ENCOUNTER — Ambulatory Visit (HOSPITAL_COMMUNITY)
Admission: EM | Admit: 2015-12-13 | Discharge: 2015-12-13 | Disposition: A | Discharge status: Home / Self Care | Payer: Medicare Other | Attending: Family Medicine | Admitting: Family Medicine

## 2015-12-13 ENCOUNTER — Encounter (HOSPITAL_COMMUNITY): Payer: Self-pay | Admitting: Emergency Medicine

## 2015-12-13 DIAGNOSIS — R300 Dysuria: Secondary | ICD-10-CM | POA: Diagnosis not present

## 2015-12-13 DIAGNOSIS — N390 Urinary tract infection, site not specified: Secondary | ICD-10-CM

## 2015-12-13 LAB — POCT URINALYSIS DIP (DEVICE)
Bilirubin Urine: NEGATIVE
Glucose, UA: 100 mg/dL — AB
Ketones, ur: NEGATIVE mg/dL
Nitrite: POSITIVE — AB
Protein, ur: 100 mg/dL — AB
Specific Gravity, Urine: <=1.005 (ref 1.005–1.030)
Urobilinogen, UA: 1 mg/dL (ref 0.0–1.0)
pH: 5 (ref 5.0–8.0)

## 2015-12-13 MED ORDER — PHENAZOPYRIDINE HCL 200 MG PO TABS: 200.0000 mg | ORAL_TABLET | Freq: Three times a day (TID) | ORAL | 0 refills | Status: DC

## 2015-12-13 MED ORDER — CEPHALEXIN 500 MG PO CAPS: 500.0000 mg | ORAL_CAPSULE | Freq: Three times a day (TID) | ORAL | 0 refills | Status: DC

## 2015-12-13 NOTE — ED Triage Notes (Signed)
The patient presented to the [facility] with a complaint of dysuria that she stated began today.

## 2015-12-13 NOTE — ED Provider Notes (Signed)
CSN: [id number]     Arrival date & time 12/13/15  50 History   First MD Initiated Contact with Patient 12/13/15 1630     Chief Complaint  Patient presents with  . Dysuria   (Consider location/radiation/quality/duration/timing/severity/associated sxs/prior Treatment) HPI History obtained from patient:   I have a urinary tract infection    TODAY began to have frequency of urination 5-6 times per day, urine burns when "it hits the skin," occasional vaginal itching.  No relief from increased fluids, cranberry juice. Denies fever/chills. Recent hx about 1 month ago of UTI. Did not take medication as directed.   Past Medical History:  Diagnosis Date  . Colon polyp   . History of detached retina repair   . Hypothyroid   . Migraine   . Onychomycosis   . Osteopenia    Past Surgical History:  Procedure Laterality Date  . EYE SURGERY    . TUBAL LIGATION     Family History  Problem Relation Age of Onset  . Alzheimer's disease Mother   . Breast cancer Mother   . Heart disease Father   . Cancer Daughter   . Cancer Maternal Grandmother     breast cancer  . Diabetes Maternal Grandmother   . Diabetes Maternal Grandfather    Social History  Substance Use Topics  . Smoking status: Former Research scientist (life sciences)  . Smokeless tobacco: Not on file  . Alcohol use No   OB History    Gravida Para Term Preterm AB Living   5 5   5   4    SAB TAB Ectopic Multiple Live Births                 Review of Systems  Denies: HEADACHE, NAUSEA, ABDOMINAL PAIN, CHEST PAIN, CONGESTION,  SHORTNESS OF BREATH  Allergies  Sulfa antibiotics and Codeine  Home Medications   Prior to Admission medications   Medication Sig Start Date End Date Taking? Authorizing Provider  b complex vitamins tablet Take 1 tablet by mouth daily.    Historical Provider, MD  cephALEXin (KEFLEX) 500 MG capsule Take 1 capsule (500 mg total) by mouth 3 (three) times daily. 12/13/15   Konrad Felix, PA  CRANBERRY PO Take by mouth.     Historical Provider, MD  cyclobenzaprine (FLEXERIL) 10 MG tablet Take 1 tablet (10 mg total) by mouth at bedtime. 04/29/15   Leandrew Koyanagi, MD  IBUPROFEN PO Take by mouth.    Historical Provider, MD  Multiple Vitamin (MULTIVITAMIN) tablet Take 1 tablet by mouth daily.    Historical Provider, MD  phenazopyridine (PYRIDIUM) 200 MG tablet Take 1 tablet (200 mg total) by mouth 3 (three) times daily. 12/13/15   Konrad Felix, PA  SYNTHROID 100 MCG tablet Take 1 tablet (100 mcg total) by mouth daily. 10/16/15   Chelle Jeffery, PA-C  valACYclovir (VALTREX) 1000 MG tablet Take 1 tablet (1,000 mg total) by mouth daily. 05/18/15   Leandrew Koyanagi, MD  zoster vaccine live, PF, (ZOSTAVAX) 09811 UNT/0.65ML injection Inject 19,400 Units into the skin once. Administer at pharmacy 04/29/15   Leandrew Koyanagi, MD   Meds Ordered and Administered this Visit  Medications - No data to display  BP 139/96 (BP Location: Right Arm)   Pulse 81   Temp 98.2 F (36.8 C) (Oral)   Resp 16   SpO2 97%  No data found.   Physical Exam NURSES NOTES AND VITAL SIGNS REVIEWED. CONSTITUTIONAL: Well developed, well nourished, no acute distress HEENT: normocephalic,  atraumatic EYES: Conjunctiva normal NECK:normal ROM, supple, no adenopathy PULMONARY:No respiratory distress, normal effort ABDOMINAL: Soft, ND, NT BS+, No CVAT MUSCULOSKELETAL: Normal ROM of all extremities,  SKIN: warm and dry without rash PSYCHIATRIC: Mood and affect, behavior are normal  Urgent Care Course   Clinical Course    Procedures (including critical care time)  Labs Review Labs Reviewed  POCT URINALYSIS DIP (DEVICE) - Abnormal; Notable for the following:       Result Value   Glucose, UA 100 (*)    Hgb urine dipstick LARGE (*)    Protein, ur 100 (*)    Nitrite POSITIVE (*)    Leukocytes, UA LARGE (*)    All other components within normal limits    Imaging Review No results found.   Visual Acuity Review  Right Eye  Distance:   Left Eye Distance:   Bilateral Distance:    Right Eye Near:   Left Eye Near:    Bilateral Near:         MDM   1. UTI (lower urinary tract infection)   2. Dysuria     Patient is reassured that there are no issues that require transfer to higher level of care at this time or additional tests. Patient is advised to continue home symptomatic treatment. Patient is advised that if there are new or worsening symptoms to attend the emergency department, contact primary care provider, or return to UC. Instructions of care provided discharged home in stable condition.    THIS NOTE WAS GENERATED USING A VOICE RECOGNITION SOFTWARE PROGRAM. ALL REASONABLE EFFORTS  WERE MADE TO PROOFREAD THIS DOCUMENT FOR ACCURACY.  I have verbally reviewed the discharge instructions with the patient. A printed AVS was given to the patient.  All questions were answered prior to discharge.      Konrad Felix, PA 12/13/15 2111

## 2015-12-13 NOTE — ED Notes (Signed)
Urine specimen obtained while patient in waiting room. Urine specimen in lab

## 2016-01-15 ENCOUNTER — Other Ambulatory Visit: Payer: Self-pay | Admitting: Physician Assistant

## 2016-03-08 DIAGNOSIS — H43811 Vitreous degeneration, right eye: Secondary | ICD-10-CM | POA: Diagnosis not present

## 2016-04-18 ENCOUNTER — Other Ambulatory Visit: Payer: Self-pay | Admitting: Physician Assistant

## 2016-05-12 ENCOUNTER — Ambulatory Visit (INDEPENDENT_AMBULATORY_CARE_PROVIDER_SITE_OTHER): Payer: Medicare HMO | Admitting: Physician Assistant

## 2016-05-12 ENCOUNTER — Encounter: Payer: Self-pay | Admitting: Physician Assistant

## 2016-05-12 VITALS — BP 137/90 | HR 70 | Temp 98.0°F | Resp 16 | Ht 65.0 in | Wt 124.0 lb

## 2016-05-12 DIAGNOSIS — M810 Age-related osteoporosis without current pathological fracture: Secondary | ICD-10-CM

## 2016-05-12 DIAGNOSIS — Z0000 Encounter for general adult medical examination without abnormal findings: Secondary | ICD-10-CM

## 2016-05-12 DIAGNOSIS — E039 Hypothyroidism, unspecified: Secondary | ICD-10-CM

## 2016-05-12 NOTE — Patient Instructions (Addendum)
Keeping You Healthy  Get These Tests  Blood Pressure- Have your blood pressure checked by your healthcare provider at least once a year.  Normal blood pressure is 120/80.  Weight- Have your body mass index (BMI) calculated to screen for obesity.  BMI is a measure of body fat based on height and weight.  You can calculate your own BMI at www.nhlbisupport.com/bmi/  Cholesterol- Have your cholesterol checked every year.  Diabetes- Have your blood sugar checked every year if you have high blood pressure, high cholesterol, a family history of diabetes or if you are overweight.  Pap Test - Have a pap test every 1 to 5 years if you have been sexually active.  If you are older than 65 and recent pap tests have been normal you may not need additional pap tests.  In addition, if you have had a hysterectomy  for benign disease additional pap tests are not necessary.  Mammogram-Yearly mammograms are essential for early detection of breast cancer  Screening for Colon Cancer- Colonoscopy starting at age 50. Screening may begin sooner depending on your family history and other health conditions.  Follow up colonoscopy as directed by your Gastroenterologist.  Screening for Osteoporosis- Screening begins at age 65 with bone density scanning, sooner if you are at higher risk for developing Osteoporosis.  Get these medicines  Calcium with Vitamin D- Your body requires 1200-1500 mg of Calcium a day and 800-1000 IU of Vitamin D a day.  You can only absorb 500 mg of Calcium at a time therefore Calcium must be taken in 2 or 3 separate doses throughout the day.  Hormones- Hormone therapy has been associated with increased risk for certain cancers and heart disease.  Talk to your healthcare provider about if you need relief from menopausal symptoms.  Aspirin- Ask your healthcare provider about taking Aspirin to prevent Heart Disease and Stroke.  Get these Immuniztions  Flu shot- Every fall  Pneumonia shot-  Once after the age of 65; if you are younger ask your healthcare provider if you need a pneumonia shot.  Tetanus- Every ten years.  Zostavax- Once after the age of 60 to prevent shingles.  Take these steps  Don't smoke- Your healthcare provider can help you quit. For tips on how to quit, ask your healthcare provider or go to www.smokefree.gov or call 1-800 QUIT-NOW.  Be physically active- Exercise 5 days a week for a minimum of 30 minutes.  If you are not already physically active, start slow and gradually work up to 30 minutes of moderate physical activity.  Try walking, dancing, bike riding, swimming, etc.  Eat a healthy diet- Eat a variety of healthy foods such as fruits, vegetables, whole grains, low fat milk, low fat cheeses, yogurt, lean meats, chicken, fish, eggs, dried beans, tofu, etc.  For more information go to www.thenutritionsource.org  Dental visit- Brush and floss teeth twice daily; visit your dentist twice a year.  Eye exam- Visit your Optometrist or Ophthalmologist yearly.  Drink alcohol in moderation- Limit alcohol intake to one drink or less a day.  Never drink and drive.  Depression- Your emotional health is as important as your physical health.  If you're feeling down or losing interest in things you normally enjoy, please talk to your healthcare provider.  Seat Belts- can save your life; always wear one  Smoke/Carbon Monoxide detectors- These detectors need to be installed on the appropriate level of your home.  Replace batteries at least once a year.  Violence- If   anyone is threatening or hurting you, please tell your healthcare provider.  Living Will/ Health care power of attorney- Discuss with your healthcare provider and family.    IF you received an x-ray today, you will receive an invoice from [facility]. Please contact  Radiology at [phone] with questions or concerns regarding your invoice.   IF you received labwork today, you  will receive an invoice from [facility]. Please contact [facility] at [phone] with questions or concerns regarding your invoice.   Our billing staff will not be able to assist you with questions regarding bills from these companies.  You will be contacted with the lab results as soon as they are available. The fastest way to get your results is to activate your My Chart account. Instructions are located on the last page of this paperwork. If you have not heard from us regarding the results in 2 weeks, please contact this office.     

## 2016-05-12 NOTE — Progress Notes (Signed)
Presents today for [employer] Visit-Subsequent.   Interpreter used for this visit? no  Patient Care Team: Mancel Bale, PA-C as PCP - General (Physician Assistant)     Other items to address today: none   Cancer Screening: Cervical: no Breast: yes Colon: yes   Other Screening: Last screening for diabetes: 04/2015 Last lipid screening: 04/2015   ADVANCE DIRECTIVES: Discussed: yes - pt has an old set when her children were young - plans to update soon On File: no Materials Provided: no   Immunization status:  Immunization History  Administered Date(s) Administered  . Pneumococcal Conjugate-13 04/02/2014  . Pneumococcal Polysaccharide-23 06/26/2005  . Tdap 06/26/2005     Health Maintenance Due  Topic Date Due  . ZOSTAVAX  11/04/1999  . TETANUS/TDAP  06/27/2015     Home Environment: married lives with husband - very active  Patient Active Problem List   Diagnosis Date Noted  . Detached retina 11/16/2011  . GERD (gastroesophageal reflux disease) 11/16/2011  . Migraine   . Osteopenia   . Hypothyroid      Past Medical History:  Diagnosis Date  . Colon polyp   . History of detached retina repair   . Hypothyroid   . Migraine   . Onychomycosis   . Osteopenia      Past Surgical History:  Procedure Laterality Date  . EYE SURGERY    . TUBAL LIGATION       Family History  Problem Relation Age of Onset  . Alzheimer's disease Mother   . Breast cancer Mother   . Heart disease Father   . Cancer Daughter   . Cancer Maternal Grandmother     breast cancer  . Diabetes Maternal Grandmother   . Diabetes Maternal Grandfather      Social History   Social History  . Marital status: Married    Spouse name: N/A  . Number of children: N/A  . Years of education: N/A   Occupational History  . pastor    Social History Main Topics  . Smoking status: Former Research scientist (life sciences)  . Smokeless tobacco: Never Used  . Alcohol use No  . Drug use:  No  . Sexual activity: Yes    Partners: Male    Birth control/ protection: Post-menopausal   Other Topics Concern  . Not on file   Social History Narrative   Marital status: married   Children: 4 daughters and a son   Lives with: husband and son and daughter in law   Employment: yes   Tobacco:  past   Alcohol:  no   Drugs:  no   Exercise:  5 days a week to the gym, 3 days a week lifting weights   Seatbelt:    Guns in home:       Secured:        Allergies  Allergen Reactions  . Sulfa Antibiotics   . Codeine      Prior to Admission medications   Medication Sig Start Date End Date Taking? Authorizing Provider  b complex vitamins tablet Take 1 tablet by mouth daily.   Yes Historical Provider, MD  Multiple Vitamin (MULTIVITAMIN) tablet Take 1 tablet by mouth daily.   Yes Historical Provider, MD  SYNTHROID 100 MCG tablet TAKE ONE TABLET BY MOUTH DAILY 04/18/16  Yes Chelle Jeffery, PA-C  CRANBERRY PO Take by mouth.    Historical Provider, MD  cyclobenzaprine (FLEXERIL) 10 MG tablet Take 1 tablet (10 mg total) by mouth  at bedtime. Patient not taking: Reported on 05/12/2016 04/29/15   Leandrew Koyanagi, MD     Depression screen [facility] 2/9 05/12/2016 04/29/2015 02/09/2015 04/02/2014  Decreased Interest 0 0 0 0  Down, Depressed, Hopeless 0 0 0 0  PHQ - 2 Score 0 0 0 0     Fall Risk  05/12/2016 04/29/2015 04/02/2014  Falls in the past year? No No No     Functional Status Survey:functional   PHYSICAL EXAM: BP 137/90   Pulse 70   Temp 98 F (36.7 C) (Oral)   Resp 16   Ht 5\' 5"  (1.651 m)   Wt 124 lb (56.2 kg)   SpO2 96%   BMI 20.63 kg/m    Wt Readings from Last 3 Encounters:  05/12/16 124 lb (56.2 kg)  04/29/15 136 lb (61.7 kg)  02/09/15 134 lb 12.8 oz (61.1 kg)      Visual Acuity Screening   Right eye Left eye Both eyes  Without correction:     With correction: 20/20 20/70 20/20       Physical Exam  Constitutional: She is oriented to person, place, and time. She  appears well-developed and well-nourished.  HENT:  Head: Normocephalic.  Right Ear: External ear normal.  Left Ear: External ear normal.  Nose: Nose normal.  Mouth/Throat: Oropharynx is clear and moist.  Eyes: Conjunctivae and EOM are normal. Pupils are equal, round, and reactive to light.  Neck: Normal range of motion. Neck supple.  Cardiovascular: Normal rate, regular rhythm and normal heart sounds.   No murmur heard. Respiratory: Effort normal and breath sounds normal. She has no wheezes.  GI: Soft. Bowel sounds are normal.  Musculoskeletal: Normal range of motion.  Neurological: She is alert and oriented to person, place, and time.  Skin: Skin is warm and dry.  Psychiatric: She has a normal mood and affect. Her behavior is normal. Judgment and thought content normal.   Education/Counseling: yes diet and exercise yes prevention of chronic diseases no smoking/tobacco cessation yes review "Covered Medicare Preventive Services"    ASSESSMENT/PLAN: Medicare annual wellness visit, subsequent  Osteoporosis without current pathological fracture, unspecified osteoporosis type - Plan: VITAMIN D 25 Hydroxy (Vit-D Deficiency, Fractures)  Hypothyroidism, unspecified type - Plan: TSH

## 2016-05-12 NOTE — Progress Notes (Deleted)
Subjective:    Wanda Hernandez is a 76 y.o. female who presents for Medicare Annual/Subsequent preventive examination.  Preventive Screening-Counseling & Management  Tobacco History  Smoking Status  . Former Smoker  Smokeless Tobacco  . Never Used     Problems Prior to Visit 1.   Current Problems (verified) Patient Active Problem List   Diagnosis Date Noted  . Detached retina 11/16/2011  . GERD (gastroesophageal reflux disease) 11/16/2011  . Migraine   . Osteopenia   . Hypothyroid     Medications Prior to Visit Current Outpatient Prescriptions on File Prior to Visit  Medication Sig Dispense Refill  . b complex vitamins tablet Take 1 tablet by mouth daily.    . Multiple Vitamin (MULTIVITAMIN) tablet Take 1 tablet by mouth daily.    Marland Kitchen SYNTHROID 100 MCG tablet TAKE ONE TABLET BY MOUTH DAILY 90 tablet 0  . CRANBERRY PO Take by mouth.    . cyclobenzaprine (FLEXERIL) 10 MG tablet Take 1 tablet (10 mg total) by mouth at bedtime. (Patient not taking: Reported on 05/12/2016) 30 tablet 2   No current facility-administered medications on file prior to visit.     Current Medications (verified) Current Outpatient Prescriptions  Medication Sig Dispense Refill  . b complex vitamins tablet Take 1 tablet by mouth daily.    . Multiple Vitamin (MULTIVITAMIN) tablet Take 1 tablet by mouth daily.    Marland Kitchen SYNTHROID 100 MCG tablet TAKE ONE TABLET BY MOUTH DAILY 90 tablet 0  . CRANBERRY PO Take by mouth.    . cyclobenzaprine (FLEXERIL) 10 MG tablet Take 1 tablet (10 mg total) by mouth at bedtime. (Patient not taking: Reported on 05/12/2016) 30 tablet 2   No current facility-administered medications for this visit.      Allergies (verified) Sulfa antibiotics and Codeine   PAST HISTORY  Family History Family History  Problem Relation Age of Onset  . Alzheimer's disease Mother   . Breast cancer Mother   . Heart disease Father   . Cancer Daughter   . Cancer Maternal Grandmother    breast cancer  . Diabetes Maternal Grandmother   . Diabetes Maternal Grandfather     Social History Social History  Substance Use Topics  . Smoking status: Former Research scientist (life sciences)  . Smokeless tobacco: Never Used  . Alcohol use No     Are there smokers in your home (other than you)? No  Risk Factors Current exercise habits: Gym/ health club routine includes walking and weight lifting.  Dietary issues discussed: yes - eats healthy   Cardiac risk factors: none.  Depression Screen Depression screen [facility] 2/9 05/12/2016 04/29/2015 02/09/2015 04/02/2014  Decreased Interest 0 0 0 0  Down, Depressed, Hopeless 0 0 0 0  PHQ - 2 Score 0 0 0 0     Activities of Daily Living In your present state of health, do you have any difficulty performing the following activities?:  Driving? Yes Managing money?  Yes Feeding yourself? Yes Getting from bed to chair? YesBP 137/90   Pulse 70   Temp 98 F (36.7 C) (Oral)   Resp 16   Ht 5\' 5"  (1.651 m)   Wt 124 lb (56.2 kg)   SpO2 96%   BMI 20.63 kg/m   General Appearance:    Alert, cooperative, no distress, appears stated age  Head:    Normocephalic, without obvious abnormality, atraumatic  Eyes:    PERRL, conjunctiva/corneas clear, EOM's intact, fundi    benign, both eyes  Ears:  Normal TM's and external ear canals, both ears  Nose:   Nares normal, septum midline, mucosa normal, no drainage    or sinus tenderness  Throat:   Lips, mucosa, and tongue normal; teeth and gums normal  Neck:   Supple, symmetrical, trachea midline, no adenopathy;    thyroid:  no enlargement/tenderness/nodules; no carotid   bruit or JVD  Back:     Symmetric, no curvature, ROM normal, no CVA tenderness  Lungs:     Clear to auscultation bilaterally, respirations unlabored  Chest Wall:    No tenderness or deformity   Heart:    Regular rate and rhythm, S1 and S2 normal, no murmur, rub   or gallop  Breast Exam:    No tenderness, masses, or nipple abnormality  Abdomen:     Soft,  non-tender, bowel sounds active all four quadrants,    no masses, no organomegaly  Genitalia:    Normal female without lesion, discharge or tenderness  Rectal:    Normal tone, normal prostate, no masses or tenderness;   guaiac negative stool  Extremities:   Extremities normal, atraumatic, no cyanosis or edema  Pulses:   2+ and symmetric all extremities  Skin:   Skin color, texture, turgor normal, no rashes or lesions  Lymph nodes:   Cervical, supraclavicular, and axillary nodes normal  Neurologic:   CNII-XII intact, normal strength, sensation and reflexes    throughout   Climbing a flight of stairs? Yes Preparing food and eating?: Yes Bathing or showering? Yes Getting dressed: Yes Getting to the toilet? Yes Using the toilet:Yes Moving around from place to place: Yes In the past year have you fallen or had a near fall?:Yes   Are you sexually active?  Yes  Do you have more than one partner?  No  Hearing Difficulties: Yes Do you often ask people to speak up or repeat themselves? No Do you experience ringing or noises in your ears? No Do you have difficulty understanding soft or whispered voices? No   Do you feel that you have a problem with memory? No  Do you often misplace items? No  Do you feel safe at home?  Yes  Cognitive Testing  Alert? Yes  Normal Appearance?Yes  Oriented to person? Yes  Place? Yes   Time? Yes  Recall of three objects?  Yes  Can perform simple calculations? Yes  Displays appropriate judgment?Yes  Can read the correct time from a watch face?Yes   Advanced Directives have been discussed with the patient? unsure  List the Names of Other Physician/Practitioners you currently use: 1.    Indicate any recent Medical Services you may have received from other than [facility] providers in the past year (date may be approximate).  Immunization History  Administered Date(s) Administered  . Pneumococcal Conjugate-13 04/02/2014  . Pneumococcal Polysaccharide-23  06/26/2005  . Tdap 06/26/2005    Screening Tests Health Maintenance  Topic Date Due  . ZOSTAVAX  11/04/1999  . TETANUS/TDAP  06/27/2015  . INFLUENZA VACCINE  06/25/2016 (Originally 10/27/2015)  . DEXA SCAN  Completed  . PNA vac Low Risk Adult  Completed    All answers were reviewed with the patient and necessary referrals were made:  [facility], PA-C   05/12/2016   History reviewed:yes  Review of Systems {ros; complete:30496}    Objective:    Visual Acuity Screening   Right eye Left eye Both eyes  Without correction:     With correction: 20/20 20/70 20/20      Body  mass index is 20.63 kg/m. BP 137/90   Pulse 70   Temp 98 F (36.7 C) (Oral)   Resp 16   Ht 5\' 5"  (1.651 m)   Wt 124 lb (56.2 kg)   SpO2 96%   BMI 20.63 kg/m   {Exam, female:18323}     Assessment:     ***     Plan:     During the course of the visit the patient was educated and counseled about appropriate screening and preventive services including:    {plan:19836}  Diet review for nutrition referral? Yes ____  Not Indicated ____   Patient Instructions (the written plan) was given to the patient.  Medicare Attestation I have personally reviewed: The patient's medical and social history Their use of alcohol, tobacco or illicit drugs Their current medications and supplements The patient's functional ability including ADLs,fall risks, home safety risks, cognitive, and hearing and visual impairment Diet and physical activities Evidence for depression or mood disorders  The patient's weight, height, BMI, and visual acuity have been recorded in the chart.  I have made referrals, counseling, and provided education to the patient based on review of the above and I have provided the patient with a written personalized care plan for preventive services.     [facility], PA-C   05/12/2016

## 2016-05-13 LAB — TSH: TSH: 0.143 u[IU]/mL — ABNORMAL LOW (ref 0.450–4.500)

## 2016-05-13 LAB — VITAMIN D 25 HYDROXY (VIT D DEFICIENCY, FRACTURES): Vit D, 25-Hydroxy: 26.5 ng/mL — ABNORMAL LOW (ref 30.0–100.0)

## 2016-05-17 MED ORDER — LEVOTHYROXINE SODIUM 88 MCG PO TABS: 88.0000 ug | ORAL_TABLET | Freq: Every day | ORAL | 0 refills | Status: DC

## 2016-05-17 NOTE — Addendum Note (Signed)
Addended by: Mancel Bale on: 05/17/2016 12:26 PM   Modules accepted: Orders

## 2016-05-25 ENCOUNTER — Telehealth: Payer: Self-pay

## 2016-05-25 DIAGNOSIS — K1379 Other lesions of oral mucosa: Secondary | ICD-10-CM

## 2016-05-25 DIAGNOSIS — E039 Hypothyroidism, unspecified: Secondary | ICD-10-CM

## 2016-05-25 NOTE — Telephone Encounter (Signed)
Pt is needing to talk with Windell Hummingbird about her physical on 05/12/16   Best number [phone]

## 2016-05-26 NOTE — Telephone Encounter (Signed)
Pt calling about name of pharmacy its [facility] in [place] phone number [phone]

## 2016-05-26 NOTE — Telephone Encounter (Addendum)
Spoke with patient.  Pt has h/o weight problems and is scared about gaining weight with the decreased dose of synthroid - she is very happy with her current weight and she works really hard keeping her weight low.  In the past when her dose has been changed she has gained a few pounds and she states that she knows herself and she is not going to feel good if she gains weight.  I am concerned that the patient is hyperactive with her current dose of medication but she is insistent about weight gain and not wanting it - I have agreed to give her another 3 months which allows her to RTC for a repeat TSH and free T4 and we will adjust medications based on the free T4 levels.  If she is overtreated I will not be willing to continue at the over treatment dose.  She has also been on valtrex in the past for mouth ulcers.  She uses as needed - about every other week - she gets them she is stressed or upset or sick.  She was encouraged to take a daily dose for prevention for the next several months to see if the occurrence decreases.

## 2016-05-26 NOTE — Telephone Encounter (Signed)
Waiting on a call back about the pharmacy.

## 2016-05-27 MED ORDER — VALACYCLOVIR HCL 1 G PO TABS: ORAL_TABLET | ORAL | 3 refills | Status: DC

## 2016-05-27 MED ORDER — LEVOTHYROXINE SODIUM 100 MCG PO TABS: 100.0000 ug | ORAL_TABLET | Freq: Every day | ORAL | 0 refills | Status: DC

## 2016-06-08 DIAGNOSIS — Z0100 Encounter for examination of eyes and vision without abnormal findings: Secondary | ICD-10-CM | POA: Diagnosis not present

## 2016-06-08 DIAGNOSIS — H524 Presbyopia: Secondary | ICD-10-CM | POA: Diagnosis not present

## 2016-07-14 DIAGNOSIS — Z961 Presence of intraocular lens: Secondary | ICD-10-CM | POA: Diagnosis not present

## 2016-07-14 DIAGNOSIS — H35342 Macular cyst, hole, or pseudohole, left eye: Secondary | ICD-10-CM | POA: Diagnosis not present

## 2016-07-14 DIAGNOSIS — Z8669 Personal history of other diseases of the nervous system and sense organs: Secondary | ICD-10-CM | POA: Diagnosis not present

## 2016-08-09 ENCOUNTER — Telehealth: Payer: Self-pay | Admitting: Physician Assistant

## 2016-08-09 DIAGNOSIS — Z8669 Personal history of other diseases of the nervous system and sense organs: Secondary | ICD-10-CM

## 2016-08-09 DIAGNOSIS — Z961 Presence of intraocular lens: Secondary | ICD-10-CM

## 2016-08-09 DIAGNOSIS — H35342 Macular cyst, hole, or pseudohole, left eye: Secondary | ICD-10-CM

## 2016-08-09 NOTE — Telephone Encounter (Signed)
Sandy from Dr. Barbette Hair office called back to say [facility] is requiring referral from PCP for DOS 07/14/16 and also wanted to know if we could allow for multiple visits in the future with referral. If we are able to place referral, I will send over information to [place]. Thanks!   [employer] [phone].

## 2016-08-09 NOTE — Telephone Encounter (Signed)
Referral done

## 2016-08-09 NOTE — Telephone Encounter (Signed)
Spoke with Lovey Newcomer from Dr. Barbette Hair office again. Diagnoses were Primary: H35.342- Macular cyst hole left eye; Z86.69- personal history of other diseases of nervous system and sense organs; Z96.1- presence of intraocular lenses. Thank you!

## 2016-08-09 NOTE — Telephone Encounter (Signed)
What is the patient diagnosis or reason for referral so I can do it?

## 2016-08-09 NOTE — Telephone Encounter (Signed)
Sandy from Dr. Meade Maw ophtalmology office called letting us know that pt went to an optometrist and then was referred to their office. Sterling said [employer] is requiring referral from PCP for that past visit. Is this something we can help with? Thanks! Sandy's callback number is [phone].

## 2016-08-23 ENCOUNTER — Encounter: Payer: Self-pay | Admitting: Physician Assistant

## 2016-08-23 ENCOUNTER — Ambulatory Visit (INDEPENDENT_AMBULATORY_CARE_PROVIDER_SITE_OTHER): Payer: Medicare HMO | Admitting: Physician Assistant

## 2016-08-23 VITALS — BP 139/86 | HR 63 | Temp 97.7°F | Resp 16 | Ht 64.5 in | Wt 121.8 lb

## 2016-08-23 DIAGNOSIS — E039 Hypothyroidism, unspecified: Secondary | ICD-10-CM | POA: Diagnosis not present

## 2016-08-23 NOTE — Progress Notes (Signed)
   Wanda Hernandez  MRN: [id number] DOB: Dec 20, 1940  PCP: Mancel Bale, PA-C  Chief Complaint  Patient presents with  . consultation    Thyroid    Subjective:  Pt presents to clinic for medication recheck and lab work.  She spoke with her daughter who recommended a change in her thyroid medications. On Sat - 63mcg and the other days she takes 155mcg - she feels good.  She has not been able to exercise as much because of medical visits for her husband and she has been eating a little worse than normal.  She is worried about getting her calcium in tid but she does eat a lot of yogurt, cheese and green leafy veggies.    Review of Systems  Cardiovascular: Negative for chest pain and palpitations.    Patient Active Problem List   Diagnosis Date Noted  . Detached retina 11/16/2011  . GERD (gastroesophageal reflux disease) 11/16/2011  . Migraine   . Osteopenia   . Hypothyroid     Current Outpatient Prescriptions on File Prior to Visit  Medication Sig Dispense Refill  . b complex vitamins tablet Take 1 tablet by mouth daily.    Marland Kitchen CRANBERRY PO Take by mouth.    . levothyroxine (SYNTHROID) 100 MCG tablet Take 1 tablet (100 mcg total) by mouth daily before breakfast. 90 tablet 0  . Multiple Vitamin (MULTIVITAMIN) tablet Take 1 tablet by mouth daily.    . valACYclovir (VALTREX) 1000 MG tablet Take 1/2 po qd for prevention and then 2 pills at onset repeat in 2h prn 90 tablet 3  . cyclobenzaprine (FLEXERIL) 10 MG tablet Take 1 tablet (10 mg total) by mouth at bedtime. (Patient not taking: Reported on 05/12/2016) 30 tablet 2   No current facility-administered medications on file prior to visit.     Allergies  Allergen Reactions  . Sulfa Antibiotics   . Codeine     Pt patients past, family and social history were reviewed and updated.   Objective:  BP 139/86   Pulse 63   Temp 97.7 F (36.5 C) (Oral)   Resp 16   Ht 5' 4.5" (1.638 m)   Wt 121 lb 12.8 oz (55.2 kg)   SpO2 97%    BMI 20.58 kg/m   Physical Exam  Constitutional: She is oriented to person, place, and time and well-developed, well-nourished, and in no distress.  HENT:  Head: Normocephalic and atraumatic.  Right Ear: Hearing and external ear normal.  Left Ear: Hearing and external ear normal.  Eyes: Conjunctivae are normal.  Neck: Normal range of motion.  Pulmonary/Chest: Effort normal.  Neurological: She is alert and oriented to person, place, and time. Gait normal.  Skin: Skin is warm and dry.  Psychiatric: Mood, memory, affect and judgment normal.  Vitals reviewed.   Assessment and Plan :  Acquired hypothyroidism - Plan: TSH, T4, Free   Check labs and adjust medications as needed  Encouraged pt to use calcium bid as she ingests a lot of calicum through her diet - she will make sure her Vit D is with her calcium supplement  Windell Hummingbird PA-C  [facility] [place] 08/23/2016 2:56 PM

## 2016-08-23 NOTE — Patient Instructions (Signed)
     IF you received an x-ray today, you will receive an invoice from [facility]. Please contact [facility] at [phone] with questions or concerns regarding your invoice.   IF you received labwork today, you will receive an invoice from [facility]. Please contact [facility] at [phone] with questions or concerns regarding your invoice.   Our billing staff will not be able to assist you with questions regarding bills from these companies.  You will be contacted with the lab results as soon as they are available. The fastest way to get your results is to activate your My Chart account. Instructions are located on the last page of this paperwork. If you have not heard from us regarding the results in 2 weeks, please contact this office.     

## 2016-08-24 LAB — TSH: TSH: 0.195 u[IU]/mL — ABNORMAL LOW (ref 0.450–4.500)

## 2016-08-24 LAB — T4, FREE: Free T4: 1.72 ng/dL (ref 0.82–1.77)

## 2016-08-27 ENCOUNTER — Encounter: Payer: Self-pay | Admitting: Physician Assistant

## 2016-08-27 DIAGNOSIS — E039 Hypothyroidism, unspecified: Secondary | ICD-10-CM

## 2016-08-29 MED ORDER — LEVOTHYROXINE SODIUM 88 MCG PO TABS: 88.0000 ug | ORAL_TABLET | Freq: Every day | ORAL | 0 refills | Status: DC

## 2016-09-01 DIAGNOSIS — R69 Illness, unspecified: Secondary | ICD-10-CM | POA: Diagnosis not present

## 2016-11-25 ENCOUNTER — Encounter: Payer: Self-pay | Admitting: Physician Assistant

## 2016-11-25 ENCOUNTER — Ambulatory Visit (INDEPENDENT_AMBULATORY_CARE_PROVIDER_SITE_OTHER): Payer: Medicare HMO | Admitting: Physician Assistant

## 2016-11-25 VITALS — BP 123/84 | HR 67 | Temp 98.3°F | Resp 18 | Ht 64.5 in | Wt 124.2 lb

## 2016-11-25 DIAGNOSIS — E039 Hypothyroidism, unspecified: Secondary | ICD-10-CM

## 2016-11-25 DIAGNOSIS — K1379 Other lesions of oral mucosa: Secondary | ICD-10-CM | POA: Diagnosis not present

## 2016-11-25 NOTE — Patient Instructions (Signed)
     IF you received an x-ray today, you will receive an invoice from [facility]. Please contact [facility] at [phone] with questions or concerns regarding your invoice.   IF you received labwork today, you will receive an invoice from [facility]. Please contact [facility] at [phone] with questions or concerns regarding your invoice.   Our billing staff will not be able to assist you with questions regarding bills from these companies.  You will be contacted with the lab results as soon as they are available. The fastest way to get your results is to activate your My Chart account. Instructions are located on the last page of this paperwork. If you have not heard from us regarding the results in 2 weeks, please contact this office.     

## 2016-11-25 NOTE — Progress Notes (Signed)
Wanda Hernandez  MRN: [id number] DOB: [birth year]  PCP: Mancel Bale, PA-C  Chief Complaint  Patient presents with  . Medication Refill    valtrex and synthroid pt would like enough to get to her annual     Subjective:  Pt presents to clinic for medication refills and lab work.  She has had a hard summer due to husband has been at [facility] in treatment for prostate cancer - he is doing well.  While they were there she tried to walk but she was unable to do exercising like she is accustomed to.  She did not eat like she normal does.  And she was incredible stressed out during this.  She feels better at a higher dose of synthyroid because she really does not want to gain weight as she has worked really hard to lose the weight and her weight is really important to her.  She otherwise feels fine.  History is obtained by patient.  Review of Systems  Constitutional: Negative for chills and fever.  Endocrine: Negative.     Patient Active Problem List   Diagnosis Date Noted  . Detached retina 11/16/2011  . GERD (gastroesophageal reflux disease) 11/16/2011  . Migraine   . Osteopenia   . Hypothyroid     Current Outpatient Prescriptions on File Prior to Visit  Medication Sig Dispense Refill  . b complex vitamins tablet Take 1 tablet by mouth daily.    Marland Kitchen CRANBERRY PO Take by mouth.    . levothyroxine (SYNTHROID, LEVOTHROID) 88 MCG tablet Take 1 tablet (88 mcg total) by mouth daily. 90 tablet 0  . Multiple Vitamin (MULTIVITAMIN) tablet Take 1 tablet by mouth daily.     No current facility-administered medications on file prior to visit.     Allergies  Allergen Reactions  . Sulfa Antibiotics   . Codeine     Past Medical History:  Diagnosis Date  . Colon polyp   . History of detached retina repair   . Hypothyroid   . Migraine   . Onychomycosis   . Osteopenia    Social History   Social History Narrative   Marital status: married   Children: 4 daughters and a son   Lives  with: husband and son and daughter in law   Employment: yes   Tobacco:  past   Alcohol:  no   Drugs:  no   Exercise:  5 days a week to the gym, 3 days a week lifting weights   Seatbelt:    Guns in home:       Secured:      Social History  Substance Use Topics  . Smoking status: Former Research scientist (life sciences)  . Smokeless tobacco: Never Used  . Alcohol use No   family history includes Alzheimer's disease in her mother; Breast cancer in her mother; Cancer in her daughter and maternal grandmother; Diabetes in her maternal grandfather and maternal grandmother; Heart disease in her father.     Objective:  BP 123/84   Pulse 67   Temp 98.3 F (36.8 C) (Oral)   Resp 18   Ht 5' 4.5" (1.638 m)   Wt 124 lb 3.2 oz (56.3 kg)   SpO2 96%   BMI 20.99 kg/m  Body mass index is 20.99 kg/m.  Physical Exam  Constitutional: She is oriented to person, place, and time and well-developed, well-nourished, and in no distress.  HENT:  Head: Normocephalic and atraumatic.  Right Ear: Hearing and external ear normal.  Left  Ear: Hearing and external ear normal.  Eyes: Conjunctivae are normal.  Neck: Normal range of motion.  Cardiovascular: Normal rate, regular rhythm and normal heart sounds.   No murmur heard. Pulmonary/Chest: Effort normal and breath sounds normal. She has no wheezes.  Neurological: She is alert and oriented to person, place, and time. Gait normal.  Skin: Skin is warm and dry.  Psychiatric: Mood, memory, affect and judgment normal.  Vitals reviewed.   Wt Readings from Last 3 Encounters:  11/25/16 124 lb 3.2 oz (56.3 kg)  08/23/16 121 lb 12.8 oz (55.2 kg)  05/12/16 124 lb (56.2 kg)     Assessment and Plan :  Acquired hypothyroidism - Plan: TSH, T4, Free, Ambulatory referral to Endocrinology- check labs - at her last several visit her TSH has been really low and her dose of synthroid has been decreased and she is not happy with the weight gain she has experienced - we will check her labs  again and we will refer her to specialist to help with her medication adjustment- her current TSH is still low but her free T4 is within normal range.  We will continue at the current dose and let the endocrinologist determine if dose change is needed.  Mouth sores - Plan: valACYclovir (VALTREX) 1000 MG tablet  Windell Hummingbird PA-C  [facility] at [place] 11/29/2016 10:03 AM

## 2016-11-26 LAB — T4, FREE: Free T4: 1.62 ng/dL (ref 0.82–1.77)

## 2016-11-26 LAB — TSH: TSH: 0.209 u[IU]/mL — ABNORMAL LOW (ref 0.450–4.500)

## 2016-11-29 MED ORDER — LEVOTHYROXINE SODIUM 88 MCG PO TABS: 88.0000 ug | ORAL_TABLET | Freq: Every day | ORAL | 0 refills | Status: DC

## 2016-11-29 MED ORDER — VALACYCLOVIR HCL 1 G PO TABS: ORAL_TABLET | ORAL | 3 refills | Status: DC

## 2016-11-29 NOTE — Addendum Note (Signed)
Addended by: Mancel Bale on: 11/29/2016 10:18 AM   Modules accepted: Orders

## 2017-01-09 ENCOUNTER — Ambulatory Visit: Payer: Medicare Other | Admitting: Endocrinology

## 2017-01-19 ENCOUNTER — Encounter: Payer: Self-pay | Admitting: Physician Assistant

## 2017-01-19 ENCOUNTER — Ambulatory Visit (INDEPENDENT_AMBULATORY_CARE_PROVIDER_SITE_OTHER): Payer: Medicare HMO | Admitting: Physician Assistant

## 2017-01-19 VITALS — BP 128/88 | HR 79 | Temp 98.4°F | Resp 16 | Ht 64.5 in | Wt 124.0 lb

## 2017-01-19 DIAGNOSIS — J988 Other specified respiratory disorders: Secondary | ICD-10-CM | POA: Diagnosis not present

## 2017-01-19 MED ORDER — AZITHROMYCIN 250 MG PO TABS: ORAL_TABLET | ORAL | 0 refills | Status: DC

## 2017-01-19 MED ORDER — PREDNISONE 20 MG PO TABS: 30.0000 mg | ORAL_TABLET | Freq: Every day | ORAL | 0 refills | Status: DC

## 2017-01-19 MED ORDER — BENZONATATE 100 MG PO CAPS: 100.0000 mg | ORAL_CAPSULE | Freq: Three times a day (TID) | ORAL | 0 refills | Status: DC | PRN

## 2017-01-19 NOTE — Patient Instructions (Addendum)
  Please hydrate well with 64 oz of water.   Take medication as prescribed.     IF you received an x-ray today, you will receive an invoice from [facility]. Please contact [facility] at [phone] with questions or concerns regarding your invoice.   IF you received labwork today, you will receive an invoice from [place]. Please contact [facility] at [phone] with questions or concerns regarding your invoice.   Our billing staff will not be able to assist you with questions regarding bills from these companies.  You will be contacted with the lab results as soon as they are available. The fastest way to get your results is to activate your My Chart account. Instructions are located on the last page of this paperwork. If you have not heard from [place] regarding the results in 2 weeks, please contact this office.

## 2017-01-19 NOTE — Progress Notes (Signed)
PRIMARY CARE AT [facility] [address] [place] [address] [phone]  Date:  01/19/2017   Name:  Wanda Hernandez   DOB:  14-Aug-1940   MRN:  [id number]  PCP:  Mancel Bale, PA-C    History of Present Illness:  Wanda Hernandez is a 76 y.o. female patient who presents to PCP with  Chief Complaint  Patient presents with  . Sore Throat    x 1 wk  . Sinusitis    x 1 wk  . Cough    x 1wk     Patient is here with 1 week of respiratory symptoms.   9 days ago, started with a very sore throat, which progressed into nasal congestion, coughing productive dark yellow.  The cough is keeping her up at night.  She feels like she is the same since last week.  She was nauseous with diarrhea and nausea 2 days ago.  No black or bloody stool.  She can not take cough drops or mucinex due to headaches.  She has done honey and zarbis.     Subjective fever and chills.  1-2 days she has better breathing.    Patient Active Problem List   Diagnosis Date Noted  . Detached retina 11/16/2011  . GERD (gastroesophageal reflux disease) 11/16/2011  . Migraine   . Osteopenia   . Hypothyroid     Past Medical History:  Diagnosis Date  . Colon polyp   . History of detached retina repair   . Hypothyroid   . Migraine   . Onychomycosis   . Osteopenia     Past Surgical History:  Procedure Laterality Date  . EYE SURGERY    . TUBAL LIGATION      Social History  Substance Use Topics  . Smoking status: Former Research scientist (life sciences)  . Smokeless tobacco: Never Used  . Alcohol use No    Family History  Problem Relation Age of Onset  . Alzheimer's disease Mother   . Breast cancer Mother   . Heart disease Father   . Cancer Daughter   . Cancer Maternal Grandmother        breast cancer  . Diabetes Maternal Grandmother   . Diabetes Maternal Grandfather     Allergies  Allergen Reactions  . Sulfa Antibiotics   . Codeine     Medication list has been reviewed and updated.  Current Outpatient  Prescriptions on File Prior to Visit  Medication Sig Dispense Refill  . b complex vitamins tablet Take 1 tablet by mouth daily.    Marland Kitchen CRANBERRY PO Take by mouth.    . levothyroxine (SYNTHROID, LEVOTHROID) 88 MCG tablet Take 1 tablet (88 mcg total) by mouth daily. 90 tablet 0  . Multiple Vitamin (MULTIVITAMIN) tablet Take 1 tablet by mouth daily.    . valACYclovir (VALTREX) 1000 MG tablet Take 1/2 po qd for prevention and then 2 pills at onset repeat in 2h prn 90 tablet 3   No current facility-administered medications on file prior to visit.     ROS ROS otherwise unremarkable unless listed above.  Physical Examination: BP 128/88   Pulse 79   Temp 98.4 F (36.9 C) (Oral)   Resp 16   Ht 5' 4.5" (1.638 m)   Wt 124 lb (56.2 kg)   SpO2 95%   BMI 20.96 kg/m  Ideal Body Weight: Weight in (lb) to have BMI = 25: 147.6  Physical Exam  Constitutional: She is oriented to person, place, and time. She appears  well-developed and well-nourished. No distress.  HENT:  Head: Normocephalic and atraumatic.  Right Ear: Tympanic membrane, external ear and ear canal normal.  Left Ear: Tympanic membrane, external ear and ear canal normal.  Nose: Mucosal edema and rhinorrhea present. Right sinus exhibits no maxillary sinus tenderness and no frontal sinus tenderness. Left sinus exhibits no maxillary sinus tenderness and no frontal sinus tenderness.  Mouth/Throat: No uvula swelling. No oropharyngeal exudate, posterior oropharyngeal edema or posterior oropharyngeal erythema.  Eyes: Pupils are equal, round, and reactive to light. Conjunctivae and EOM are normal.  Cardiovascular: Normal rate and regular rhythm.  Exam reveals no gallop, no distant heart sounds and no friction rub.   No murmur heard. Pulmonary/Chest: Effort normal. No respiratory distress. She has no decreased breath sounds. She has no wheezes. She has no rhonchi.  Lymphadenopathy:       Head (right side): No submandibular, no tonsillar, no  preauricular and no posterior auricular adenopathy present.       Head (left side): No submandibular, no tonsillar, no preauricular and no posterior auricular adenopathy present.  Neurological: She is alert and oriented to person, place, and time.  Skin: She is not diaphoretic.  Psychiatric: She has a normal mood and affect. Her behavior is normal.     Assessment and Plan: Wanda Hernandez is a 76 y.o. female who is here today for cc of  Chief Complaint  Patient presents with  . Sore Throat    x 1 wk  . Sinusitis    x 1 wk  . Cough    x 1wk   Respiratory infection - Plan: azithromycin (ZITHROMAX) 250 MG tablet, predniSONE (DELTASONE) 20 MG tablet, benzonatate (TESSALON) 100 MG capsule  Ivar Drape, PA-C [facility] 10/28/201812:38 PM

## 2017-02-24 ENCOUNTER — Other Ambulatory Visit: Payer: Self-pay

## 2017-02-24 ENCOUNTER — Ambulatory Visit: Payer: Medicare HMO | Admitting: Physician Assistant

## 2017-02-24 ENCOUNTER — Encounter: Payer: Self-pay | Admitting: Physician Assistant

## 2017-02-24 VITALS — BP 128/82 | HR 68 | Temp 98.0°F | Resp 16 | Ht 64.0 in | Wt 125.0 lb

## 2017-02-24 DIAGNOSIS — E039 Hypothyroidism, unspecified: Secondary | ICD-10-CM | POA: Diagnosis not present

## 2017-02-24 NOTE — Patient Instructions (Addendum)
We will contact you with your lab results.    IF you received an x-ray today, you will receive an invoice from [facility]. Please contact [facility] at [phone] with questions or concerns regarding your invoice.   IF you received labwork today, you will receive an invoice from [facility]. Please contact [facility] at [phone] with questions or concerns regarding your invoice.   Our billing staff will not be able to assist you with questions regarding bills from these companies.  You will be contacted with the lab results as soon as they are available. The fastest way to get your results is to activate your My Chart account. Instructions are located on the last page of this paperwork. If you have not heard from [place] regarding the results in 2 weeks, please contact this office.    '

## 2017-02-24 NOTE — Progress Notes (Signed)
PRIMARY CARE AT [facility] [address] [place] [address] [phone]  Date:  02/24/2017   Name:  Wanda Hernandez   DOB:  Oct 07, 1940   MRN:  [id number]  PCP:  Mancel Bale, PA-C    History of Present Illness:  Wanda Hernandez is a 76 y.o. female patient who presents to PCP with  Chief Complaint  Patient presents with  . Medication Refill    synthriod     Patient is here today for medication refill.  She is having no symptoms or side effects to her medications.  She notes no diarrhea, chest pains, palpitations, oily or brittle hair, skin changes, or constipation, etc.  She has an appointment with endocrinology this week, ordered at her last visit by Gale Journey, PA-C and her pcp.  She notes that she does not want to go to this appointment.  She notes that she is concerned with changing her medications.  She note that there is concern with going back and forth to endocrinologist, but it is moreso due to changing her thyroid medications.  There is concern of weight gain if her thyroid medication is changed too much.  She reports that she has attempted to stay within 125-130.  She has struggled with weight in childhood, and it wasn't until around 17 years ago (2001) where she fasted for 21 days and had a decrease in her weight.  She also noted concern that when her thyroid medications were changed in the past, she had some weight gain.  After travelling back and forth 3 months ago with her husband's cancer, she had gained 5lbs, she reports because she was not able to exercise as she did.  She does also endorse that her diet had to change as she was not living at home at that time.  She reports when asked if she is taking the 60mcg daily as prescribed, she reports yes.   Patient Active Problem List   Diagnosis Date Noted  . Detached retina 11/16/2011  . GERD (gastroesophageal reflux disease) 11/16/2011  . Migraine   . Osteopenia   . Hypothyroid     Past Medical History:  Diagnosis  Date  . Colon polyp   . History of detached retina repair   . Hypothyroid   . Migraine   . Onychomycosis   . Osteopenia     Past Surgical History:  Procedure Laterality Date  . EYE SURGERY    . TUBAL LIGATION      Social History   Tobacco Use  . Smoking status: Former Research scientist (life sciences)  . Smokeless tobacco: Never Used  Substance Use Topics  . Alcohol use: No  . Drug use: No    Family History  Problem Relation Age of Onset  . Alzheimer's disease Mother   . Breast cancer Mother   . Heart disease Father   . Cancer Daughter   . Cancer Maternal Grandmother        breast cancer  . Diabetes Maternal Grandmother   . Diabetes Maternal Grandfather     Allergies  Allergen Reactions  . Sulfa Antibiotics   . Codeine     Medication list has been reviewed and updated.  Current Outpatient Medications on File Prior to Visit  Medication Sig Dispense Refill  . b complex vitamins tablet Take 1 tablet by mouth daily.    Marland Kitchen CRANBERRY PO Take by mouth.    . levothyroxine (SYNTHROID, LEVOTHROID) 88 MCG tablet Take 1 tablet (88 mcg total) by mouth daily. [place]  tablet 0  . Multiple Vitamin (MULTIVITAMIN) tablet Take 1 tablet by mouth daily.    . predniSONE (DELTASONE) 20 MG tablet Take 1.5 tablets (30 mg total) by mouth daily with breakfast. 5 tablet 0  . valACYclovir (VALTREX) 1000 MG tablet Take 1/2 po qd for prevention and then 2 pills at onset repeat in 2h prn 90 tablet 3  . azithromycin (ZITHROMAX) 250 MG tablet Take 2 tabs PO x 1 dose, then 1 tab PO QD x 4 days (Patient not taking: Reported on 02/24/2017) 6 tablet 0  . benzonatate (TESSALON) 100 MG capsule Take 1-2 capsules (100-200 mg total) by mouth 3 (three) times daily as needed for cough. (Patient not taking: Reported on 02/24/2017) 40 capsule 0   No current facility-administered medications on file prior to visit.     ROS ROS otherwise unremarkable unless listed above.  Physical Examination: BP 128/82   Pulse 68   Temp 98 F (36.7  C) (Oral)   Resp 16   Ht 5\' 4"  (1.626 m)   Wt 125 lb (56.7 kg)   SpO2 98%   BMI 21.46 kg/m  Ideal Body Weight: Weight in (lb) to have BMI = 25: 145.3  Physical Exam  Constitutional: She is oriented to person, place, and time. She appears well-developed and well-nourished. No distress.  HENT:  Head: Normocephalic and atraumatic.  Right Ear: External ear normal.  Left Ear: External ear normal.  Eyes: Conjunctivae and EOM are normal. Pupils are equal, round, and reactive to light.  Neck: No thyroid mass and no thyromegaly present.  Cardiovascular: Normal rate.  Pulmonary/Chest: Effort normal. No respiratory distress.  Neurological: She is alert and oriented to person, place, and time.  Skin: She is not diaphoretic.  Psychiatric: She has a normal mood and affect. Her behavior is normal.     Assessment and Plan: [name] is a 76 y.o. female who is here today for cc of  Chief Complaint  Patient presents with  . Medication Refill    synthriod  I did advise that it is the recommendation that she follow through with her endocrinologist.  I will obtain the tsh and t4 for the endocrinologist.  I will also be willing to change manage this outside of the specialist if we can get this a little bit more tightly controlled and in normal range, if she is willing to make some changes with dosing.  I will discuss with pcp, and make sure that she is on board with this as well, as she appears to be willing to adjust her dosing. Hypothyroidism, unspecified type - Plan: TSH, T4, Free, levothyroxine (SYNTHROID, LEVOTHROID) 75 MCG tablet  Ivar Drape, PA-C [facility] 12/4/20189:35 AM  I am decreasing her medication to 75mcg.  She will return in 6-8 weeks for recheck.

## 2017-02-25 LAB — TSH: TSH: 0.405 u[IU]/mL — ABNORMAL LOW (ref 0.450–4.500)

## 2017-02-25 LAB — T4, FREE: Free T4: 1.51 ng/dL (ref 0.82–1.77)

## 2017-02-28 MED ORDER — LEVOTHYROXINE SODIUM 75 MCG PO TABS: 75.0000 ug | ORAL_TABLET | Freq: Every day | ORAL | 3 refills | Status: DC

## 2017-02-28 MED ORDER — LEVOTHYROXINE SODIUM 75 MCG PO TABS: 75.0000 ug | ORAL_TABLET | Freq: Every day | ORAL | 1 refills | Status: DC

## 2017-03-07 ENCOUNTER — Ambulatory Visit: Payer: Medicare Other | Admitting: Internal Medicine

## 2017-04-03 DIAGNOSIS — R69 Illness, unspecified: Secondary | ICD-10-CM | POA: Diagnosis not present

## 2017-04-04 DIAGNOSIS — R69 Illness, unspecified: Secondary | ICD-10-CM | POA: Diagnosis not present

## 2017-04-28 ENCOUNTER — Telehealth: Payer: Self-pay

## 2017-04-28 NOTE — Telephone Encounter (Signed)
Called pt to schedule CPE and AWV. Pt has concerns about getting her lab work done in the office and so I offered to have a nurse call her back to discuss this further.    Josepha Pigg, B.A.  Care Guide - Primary Care at [place]

## 2017-05-03 ENCOUNTER — Telehealth: Payer: Self-pay | Admitting: Physician Assistant

## 2017-05-03 DIAGNOSIS — E039 Hypothyroidism, unspecified: Secondary | ICD-10-CM

## 2017-05-03 NOTE — Telephone Encounter (Signed)
Copied from [place] [id number]. Topic: Quick Communication - See Telephone Encounter >> May 03, 2017  2:06 PM Arletha Grippe wrote: CRM for notification. See Telephone encounter for:   05/03/17. Pt called - she is asking for a lab requisition to be mailed to her [place] for [place] [id number]. She would to [facility]. Per pt, she does not need to come in for appt regarding her TSH, nor is she due for a cpe.

## 2017-05-04 NOTE — Telephone Encounter (Signed)
Patient would like to pick up a lab order so she can go to the [facility] that is near her in [place], [place]. She stated she can come pick it up tomorrow afternoon. Please Advise

## 2017-05-04 NOTE — Telephone Encounter (Signed)
Per note she was to return for recheck in 6-8 weeks.  So that is what she needs to do.  You may send this to her pcp, and she may want differently!

## 2017-05-05 ENCOUNTER — Ambulatory Visit: Payer: Medicare Other | Admitting: Internal Medicine

## 2017-05-05 NOTE — Telephone Encounter (Signed)
I am not sure what is going on.  At her last appt with me we had decided that the best option for her was endocrinology.  If she is not going there - is she planning on having labs done and then a visit with me afterwards so we can discuss the results?  I would like to know the patient's plan with her care before I do the order.  I have no problems putting in the order and then having her see me within the month of those labs and I have already done that and she can come and pick up the order.  Please make sure the lab prints out the requisition for the patient to pick up.

## 2017-05-08 NOTE — Telephone Encounter (Signed)
Pt notified and verbalized understanding, lab order printed and mailed to pt

## 2017-05-25 DIAGNOSIS — R69 Illness, unspecified: Secondary | ICD-10-CM | POA: Diagnosis not present

## 2017-06-07 ENCOUNTER — Other Ambulatory Visit: Payer: Self-pay | Admitting: Physician Assistant

## 2017-06-07 DIAGNOSIS — K1379 Other lesions of oral mucosa: Secondary | ICD-10-CM

## 2017-06-17 ENCOUNTER — Ambulatory Visit (INDEPENDENT_AMBULATORY_CARE_PROVIDER_SITE_OTHER): Payer: Medicare HMO | Admitting: Physician Assistant

## 2017-06-17 ENCOUNTER — Other Ambulatory Visit: Payer: Self-pay

## 2017-06-17 ENCOUNTER — Encounter: Payer: Self-pay | Admitting: Physician Assistant

## 2017-06-17 ENCOUNTER — Ambulatory Visit: Payer: Medicare HMO | Admitting: Physician Assistant

## 2017-06-17 VITALS — BP 130/81 | HR 65 | Temp 97.4°F | Ht 64.33 in | Wt 125.8 lb

## 2017-06-17 DIAGNOSIS — Z13220 Encounter for screening for lipoid disorders: Secondary | ICD-10-CM

## 2017-06-17 DIAGNOSIS — Z1329 Encounter for screening for other suspected endocrine disorder: Secondary | ICD-10-CM | POA: Diagnosis not present

## 2017-06-17 DIAGNOSIS — Z0000 Encounter for general adult medical examination without abnormal findings: Secondary | ICD-10-CM

## 2017-06-17 DIAGNOSIS — Z13228 Encounter for screening for other metabolic disorders: Secondary | ICD-10-CM

## 2017-06-17 DIAGNOSIS — Z13820 Encounter for screening for osteoporosis: Secondary | ICD-10-CM

## 2017-06-17 DIAGNOSIS — Z130 Encounter for screening for diseases of the blood and blood-forming organs and certain disorders involving the immune mechanism: Secondary | ICD-10-CM | POA: Diagnosis not present

## 2017-06-17 NOTE — Patient Instructions (Addendum)
IF you received an x-ray today, you will receive an invoice from [facility]. Please contact [facility] at [phone] with questions or concerns regarding your invoice.   IF you received labwork today, you will receive an invoice from [facility]. Please contact [facility] at [phone] with questions or concerns regarding your invoice.   Our billing staff will not be able to assist you with questions regarding bills from these companies.  You will be contacted with the lab results as soon as they are available. The fastest way to get your results is to activate your My Chart account. Instructions are located on the last page of this paperwork. If you have not heard from [place] regarding the results in 2 weeks, please contact this office.    cpeKeeping You Healthy  Get These Tests  Blood Pressure- Have your blood pressure checked by your healthcare provider at least once a year.  Normal blood pressure is 120/80.  Weight- Have your body mass index (BMI) calculated to screen for obesity.  BMI is a measure of body fat based on height and weight.  You can calculate your own BMI at [employer]  Cholesterol- Have your cholesterol checked every year.  Diabetes- Have your blood sugar checked every year if you have high blood pressure, high cholesterol, a family history of diabetes or if you are overweight.  Pap Test - Have a pap test every 1 to 5 years if you have been sexually active.  If you are older than 65 and recent pap tests have been normal you may not need additional pap tests.  In addition, if you have had a hysterectomy  for benign disease additional pap tests are not necessary.  Mammogram-Yearly mammograms are essential for early detection of breast cancer  Screening for Colon Cancer- Colonoscopy starting at age 52. Screening may begin sooner depending on your family history and other health conditions.  Follow up colonoscopy as directed by your  Gastroenterologist.  Screening for Osteoporosis- Screening begins at age 51 with bone density scanning, sooner if you are at higher risk for developing Osteoporosis.  Get these medicines  Calcium with Vitamin D- Your body requires 1200-1500 mg of Calcium a day and [phone] IU of Vitamin D a day.  You can only absorb 500 mg of Calcium at a time therefore Calcium must be taken in 2 or 3 separate doses throughout the day.  Hormones- Hormone therapy has been associated with increased risk for certain cancers and heart disease.  Talk to your healthcare provider about if you need relief from menopausal symptoms.  Aspirin- Ask your healthcare provider about taking Aspirin to prevent Heart Disease and Stroke.  Get these Immuniztions  Flu shot- Every fall  Pneumonia shot- Once after the age of 38; if you are younger ask your healthcare provider if you need a pneumonia shot.  Tetanus- Every ten years.  Zostavax- Once after the age of 53 to prevent shingles.  Take these steps  Don't smoke- Your healthcare provider can help you quit. For tips on how to quit, ask your healthcare provider or go to www.smokefree.gov or call 1-800 QUIT-NOW.  Be physically active- Exercise 5 days a week for a minimum of 30 minutes.  If you are not already physically active, start slow and gradually work up to 30 minutes of moderate physical activity.  Try walking, dancing, bike riding, swimming, etc.  Eat a healthy diet- Eat a variety of healthy foods such as fruits, vegetables, whole grains, low fat  milk, low fat cheeses, yogurt, lean meats, chicken, fish, eggs, dried beans, tofu, etc.  For more information go to www.thenutritionsource.org  Dental visit- Brush and floss teeth twice daily; visit your dentist twice a year.  Eye exam- Visit your Optometrist or Ophthalmologist yearly.  Drink alcohol in moderation- Limit alcohol intake to one drink or less a day.  Never drink and drive.  Depression- Your emotional  health is as important as your physical health.  If you're feeling down or losing interest in things you normally enjoy, please talk to your healthcare provider.  Seat Belts- can save your life; always wear one  Smoke/Carbon Monoxide detectors- These detectors need to be installed on the appropriate level of your home.  Replace batteries at least once a year.  Violence- If anyone is threatening or hurting you, please tell your healthcare provider. Living Will/ Health care power of attorney- Discuss with your healthcare provider and family.

## 2017-06-17 NOTE — Progress Notes (Signed)
PRIMARY CARE AT [facility] [address], [place] [address] [phone]  Date:  06/17/2017   Name:  Wanda Hernandez   DOB:  [birth year]   MRN:  [id number]  PCP:  Mancel Bale, PA-C    History of Present Illness:  Wanda Hernandez is a 77 y.o. female patient who presents to PCP with  Chief Complaint  Patient presents with  . Annual Exam     DIET she eats a lot of fruit.  She has protein diet to avoid headaches, she reports.  She eats yogurt and fruit, she reports.  She eats nuts such as pecans.  Lunch consist of fruit and yogurt.  Dinner consist of beef and chicken.  She is eating vegetables daily.  Broccoli cauliflower, green beans, carrots, spinach. Water intake: 4 glasses per day.  1 cup of coffee per day.  She is trying to take in calcium.  She does not have dessert.    BM: normal.  No black or bloody stools  URINATION: normal  SLEEP: sleeping much better.  Started communion before bed.    SOCIAL ACTIVITY: she is actively exercising regularly.     Patient Active Problem List   Diagnosis Date Noted  . Detached retina 11/16/2011  . GERD (gastroesophageal reflux disease) 11/16/2011  . Migraine   . Osteopenia   . Hypothyroid     Past Medical History:  Diagnosis Date  . Colon polyp   . History of detached retina repair   . Hypothyroid   . Migraine   . Onychomycosis   . Osteopenia     Past Surgical History:  Procedure Laterality Date  . EYE SURGERY    . TUBAL LIGATION      Social History   Tobacco Use  . Smoking status: Former Research scientist (life sciences)  . Smokeless tobacco: Never Used  Substance Use Topics  . Alcohol use: No  . Drug use: No    Family History  Problem Relation Age of Onset  . Alzheimer's disease Mother   . Breast cancer Mother   . Heart disease Father   . Cancer Daughter   . Cancer Maternal Grandmother        breast cancer  . Diabetes Maternal Grandmother   . Diabetes Maternal Grandfather     Allergies  Allergen Reactions  . Sulfa  Antibiotics   . Codeine     Medication list has been reviewed and updated.  Current Outpatient Medications on File Prior to Visit  Medication Sig Dispense Refill  . levothyroxine (SYNTHROID, LEVOTHROID) 75 MCG tablet Take 1 tablet (75 mcg total) by mouth daily. 90 tablet 1  . valACYclovir (VALTREX) 1000 MG tablet Take 1/2 po qd for prevention and then 2 pills at onset repeat in 2h prn 90 tablet 3  . azithromycin (ZITHROMAX) 250 MG tablet Take 2 tabs PO x 1 dose, then 1 tab PO QD x 4 days (Patient not taking: Reported on 02/24/2017) 6 tablet 0  . b complex vitamins tablet Take 1 tablet by mouth daily.    . benzonatate (TESSALON) 100 MG capsule Take 1-2 capsules (100-200 mg total) by mouth 3 (three) times daily as needed for cough. (Patient not taking: Reported on 02/24/2017) 40 capsule 0  . CRANBERRY PO Take by mouth.    . Multiple Vitamin (MULTIVITAMIN) tablet Take 1 tablet by mouth daily.    . predniSONE (DELTASONE) 20 MG tablet Take 1.5 tablets (30 mg total) by mouth daily with breakfast. (Patient not taking: Reported on 06/17/2017)  5 tablet 0  . valACYclovir (VALTREX) 1000 MG tablet TAKE ONE-HALF TABLET BY MOUTH DAILY FOR PREVENTION, THEN TAKE 2 TABLETS BY MOUTH AT ONSET, THEN REPEAT IN 2 HOURS AS NEEDED (Patient not taking: Reported on 06/17/2017) 60 tablet 2   No current facility-administered medications on file prior to visit.     Review of Systems  Constitutional: Negative for chills and fever.  HENT: Negative for ear discharge, ear pain and sore throat.   Eyes: Negative for blurred vision and double vision.  Respiratory: Negative for cough, shortness of breath and wheezing.   Cardiovascular: Negative for chest pain, palpitations and leg swelling.  Gastrointestinal: Negative for diarrhea, nausea and vomiting.  Genitourinary: Negative for dysuria, frequency and hematuria.  Skin: Negative for itching and rash.  Neurological: Negative for dizziness and headaches.   ROS otherwise  unremarkable unless listed above.  Physical Examination: BP 130/81 (BP Location: Left Arm, Patient Position: Sitting, Cuff Size: Normal)   Pulse 65   Temp (!) 97.4 F (36.3 C) (Oral)   Ht 5' 4.33" (1.634 m)   Wt 125 lb 12.8 oz (57.1 kg)   SpO2 97%   BMI 21.37 kg/m  Ideal Body Weight: Weight in (lb) to have BMI = 25: 146.8  Physical Exam  Constitutional: She is oriented to person, place, and time. She appears well-developed and well-nourished. No distress.  HENT:  Head: Normocephalic and atraumatic.  Right Ear: Tympanic membrane, external ear and ear canal normal.  Left Ear: Tympanic membrane, external ear and ear canal normal.  Nose: Right sinus exhibits no maxillary sinus tenderness and no frontal sinus tenderness. Left sinus exhibits no maxillary sinus tenderness and no frontal sinus tenderness.  Mouth/Throat: Oropharynx is clear and moist. No uvula swelling. No oropharyngeal exudate, posterior oropharyngeal edema or posterior oropharyngeal erythema.  Eyes: Pupils are equal, round, and reactive to light. Conjunctivae and EOM are normal.  Neck: Normal range of motion. Neck supple. No thyromegaly present.  Cardiovascular: Normal rate, regular rhythm, normal heart sounds and intact distal pulses. Exam reveals no gallop, no distant heart sounds and no friction rub.  No murmur heard. Pulmonary/Chest: Effort normal and breath sounds normal. No respiratory distress. She has no decreased breath sounds. She has no wheezes. She has no rhonchi.  Abdominal: Soft. Bowel sounds are normal. She exhibits no distension and no mass. There is no tenderness.  Musculoskeletal: Normal range of motion. She exhibits no edema or tenderness.  Lymphadenopathy:       Head (right side): No submandibular, no tonsillar, no preauricular and no posterior auricular adenopathy present.       Head (left side): No submandibular, no tonsillar, no preauricular and no posterior auricular adenopathy present.    She has no  cervical adenopathy.  Neurological: She is alert and oriented to person, place, and time. No cranial nerve deficit. She exhibits normal muscle tone. Coordination normal.  Skin: Skin is warm and dry. She is not diaphoretic.  Psychiatric: She has a normal mood and affect. Her behavior is normal.     Assessment and Plan: ADDYLYNN BALIN is a 77 y.o. female who is here today for cc of  Chief Complaint  Patient presents with  . Annual Exam   Annual physical exam - Plan: CBC, Comprehensive metabolic panel, Lipid panel, Thyroid Panel With TSH, VITAMIN D 25 Hydroxy (Vit-D Deficiency, Fractures)  Screening for deficiency anemia - Plan: CBC  Screening for metabolic disorder - Plan: Comprehensive metabolic panel  Screening for lipid disorders - Plan:  Lipid panel  Screening for thyroid disorder - Plan: Thyroid Panel With TSH  Screening for osteoporosis - Plan: VITAMIN D 25 Hydroxy (Vit-D Deficiency, Fractures)  Ivar Drape, PA-C [facility] 3/29/20191:57 PM

## 2017-06-19 LAB — SPECIMEN STATUS REPORT

## 2017-06-20 LAB — CBC
Hematocrit: 45.5 % (ref 34.0–46.6)
Hemoglobin: 15.4 g/dL (ref 11.1–15.9)
MCH: 32.3 pg (ref 26.6–33.0)
MCHC: 33.8 g/dL (ref 31.5–35.7)
MCV: 95 fL (ref 79–97)
Platelets: 207 10*3/uL (ref 150–379)
RBC: 4.77 x10E6/uL (ref 3.77–5.28)
RDW: 14.4 % (ref 12.3–15.4)
WBC: 4.3 10*3/uL (ref 3.4–10.8)

## 2017-06-20 LAB — LIPID PANEL
Chol/HDL Ratio: 2.5 ratio (ref 0.0–4.4)
Cholesterol, Total: 195 mg/dL (ref 100–199)
HDL: 77 mg/dL (ref >39)
LDL Calculated: 101 mg/dL — ABNORMAL HIGH (ref 0–99)
Triglycerides: 87 mg/dL (ref 0–149)
VLDL Cholesterol Cal: 17 mg/dL (ref 5–40)

## 2017-06-20 LAB — COMPREHENSIVE METABOLIC PANEL
ALT: 23 IU/L (ref 0–32)
AST: 21 IU/L (ref 0–40)
Albumin/Globulin Ratio: 2 (ref 1.2–2.2)
Albumin: 4.5 g/dL (ref 3.5–4.8)
Alkaline Phosphatase: 71 IU/L (ref 39–117)
BUN/Creatinine Ratio: 30 — ABNORMAL HIGH (ref 12–28)
BUN: 25 mg/dL (ref 8–27)
Bilirubin Total: 0.6 mg/dL (ref 0.0–1.2)
CO2: 25 mmol/L (ref 20–29)
Calcium: 9.5 mg/dL (ref 8.7–10.3)
Chloride: 108 mmol/L — ABNORMAL HIGH (ref 96–106)
Creatinine, Ser: 0.83 mg/dL (ref 0.57–1.00)
GFR calc Af Amer: 79 mL/min/{1.73_m2} (ref >59)
GFR calc non Af Amer: 68 mL/min/{1.73_m2} (ref >59)
Globulin, Total: 2.2 g/dL (ref 1.5–4.5)
Glucose: 103 mg/dL — ABNORMAL HIGH (ref 65–99)
Potassium: 4.6 mmol/L (ref 3.5–5.2)
Sodium: 146 mmol/L — ABNORMAL HIGH (ref 134–144)
Total Protein: 6.7 g/dL (ref 6.0–8.5)

## 2017-06-20 LAB — THYROID PANEL WITH TSH
Free Thyroxine Index: 2.3 (ref 1.2–4.9)
T3 Uptake Ratio: 30 % (ref 24–39)
T4, Total: 7.8 ug/dL (ref 4.5–12.0)
TSH: 3.09 u[IU]/mL (ref 0.450–4.500)

## 2017-06-20 LAB — VITAMIN D 25 HYDROXY (VIT D DEFICIENCY, FRACTURES): Vit D, 25-Hydroxy: 21.6 ng/mL — ABNORMAL LOW (ref 30.0–100.0)

## 2017-06-23 ENCOUNTER — Encounter: Payer: Self-pay | Admitting: Physician Assistant

## 2017-06-28 ENCOUNTER — Other Ambulatory Visit: Payer: Self-pay

## 2017-06-28 ENCOUNTER — Telehealth: Payer: Self-pay | Admitting: Physician Assistant

## 2017-06-28 DIAGNOSIS — E039 Hypothyroidism, unspecified: Secondary | ICD-10-CM

## 2017-06-28 MED ORDER — LEVOTHYROXINE SODIUM 75 MCG PO TABS: 75.0000 ug | ORAL_TABLET | Freq: Every day | ORAL | 1 refills | Status: DC

## 2017-06-28 NOTE — Telephone Encounter (Signed)
Copied from [place]. Topic: Quick Communication - Rx Refill/Question >> Jun 28, 2017  1:39 PM Scherrie Gerlach wrote: Medication: levothyroxine (SYNTHROID, LEVOTHROID) 75 MCG tablet  Pt had cpe on 3/23 and has not had her thyroid med called in.  Pt wants to know if she is still on the correct dose, and could the dr please call in a new script  [facility] [place], [place] - [place] [phone] (Phone) [phone] (Fax)

## 2017-07-05 ENCOUNTER — Encounter: Payer: Self-pay | Admitting: Physician Assistant

## 2017-07-10 ENCOUNTER — Telehealth: Payer: Self-pay

## 2017-07-10 DIAGNOSIS — Z0100 Encounter for examination of eyes and vision without abnormal findings: Secondary | ICD-10-CM

## 2017-07-10 NOTE — Telephone Encounter (Signed)
Referral pended for provider signature.

## 2017-07-10 NOTE — Telephone Encounter (Signed)
Copied from [place] [id number]. Topic: Quick Communication - See Telephone Encounter >> Jul 10, 2017 10:44 AM Percell Belt A wrote: CRM for notification. See Telephone encounter for: 07/10/17.  Pt called in and per ins she needs a referral to an eye dr, she is just in need of a yearly exam   Dr Ander Slade  Address: [address] [place] [address], [place],  [address] Phone: [phone]

## 2017-10-04 DIAGNOSIS — R69 Illness, unspecified: Secondary | ICD-10-CM | POA: Diagnosis not present

## 2017-10-06 DIAGNOSIS — Z961 Presence of intraocular lens: Secondary | ICD-10-CM | POA: Diagnosis not present

## 2017-10-06 DIAGNOSIS — H0288B Meibomian gland dysfunction left eye, upper and lower eyelids: Secondary | ICD-10-CM | POA: Diagnosis not present

## 2017-10-06 DIAGNOSIS — H0288A Meibomian gland dysfunction right eye, upper and lower eyelids: Secondary | ICD-10-CM | POA: Diagnosis not present

## 2017-10-06 DIAGNOSIS — H26491 Other secondary cataract, right eye: Secondary | ICD-10-CM | POA: Diagnosis not present

## 2017-10-06 DIAGNOSIS — Z8669 Personal history of other diseases of the nervous system and sense organs: Secondary | ICD-10-CM | POA: Diagnosis not present

## 2017-12-10 ENCOUNTER — Ambulatory Visit (HOSPITAL_COMMUNITY)
Admission: EM | Admit: 2017-12-10 | Discharge: 2017-12-10 | Disposition: A | Discharge status: Home / Self Care | Payer: Medicare HMO | Attending: Internal Medicine | Admitting: Internal Medicine

## 2017-12-10 ENCOUNTER — Other Ambulatory Visit: Payer: Self-pay

## 2017-12-10 ENCOUNTER — Encounter (HOSPITAL_COMMUNITY): Payer: Self-pay | Admitting: *Deleted

## 2017-12-10 ENCOUNTER — Ambulatory Visit (INDEPENDENT_AMBULATORY_CARE_PROVIDER_SITE_OTHER): Payer: Medicare HMO

## 2017-12-10 DIAGNOSIS — J439 Emphysema, unspecified: Secondary | ICD-10-CM

## 2017-12-10 DIAGNOSIS — J22 Unspecified acute lower respiratory infection: Secondary | ICD-10-CM

## 2017-12-10 DIAGNOSIS — R05 Cough: Secondary | ICD-10-CM | POA: Diagnosis not present

## 2017-12-10 MED ORDER — AZITHROMYCIN 250 MG PO TABS: 250.0000 mg | ORAL_TABLET | Freq: Every day | ORAL | 0 refills | Status: DC

## 2017-12-10 MED ORDER — ALBUTEROL SULFATE HFA 108 (90 BASE) MCG/ACT IN AERS
2.0000 | INHALATION_SPRAY | Freq: Once | RESPIRATORY_TRACT | Status: AC
  Administered 2017-12-10: 2 via RESPIRATORY_TRACT

## 2017-12-10 MED ORDER — PREDNISONE 20 MG PO TABS: 20.0000 mg | ORAL_TABLET | Freq: Two times a day (BID) | ORAL | 0 refills | Status: AC

## 2017-12-10 MED ORDER — ALBUTEROL SULFATE HFA 108 (90 BASE) MCG/ACT IN AERS
INHALATION_SPRAY | RESPIRATORY_TRACT | Status: AC
Start: 2017-12-10 — End: 2017-12-10
  Filled 2017-12-10: qty 6.7

## 2017-12-10 NOTE — ED Triage Notes (Signed)
Reports having cold sxs starting 2 wks ago, then on day 2 had "very high fever" which lasted 1 day.  Throughout past wk cough sxs increasing and having coughing fits.

## 2017-12-10 NOTE — ED Provider Notes (Signed)
[place]   [id number] 12/10/17 Arrival Time: 4128  CC: Cough  SUBJECTIVE:  Wanda Hernandez is a 77 y.o. female previous smoker who presents with cough x 2 weeks.  Admits to positive sick exposure to son.  Describes cough as intermittent and productive with yellow/green sputum.  Has tried OTC medications like mucinex without relief.  Symptoms are made worse with laying down at night.  Reports previous symptoms in the past.  Complains of subjective fever, fatigue, sinus pressure, and rhinorrhea initially, but now symptoms resolved. Denies chills, sore throat, SOB, wheezing, chest pain, nausea, changes in bowel or bladder habits.    ROS: As per HPI.  Past Medical History:  Diagnosis Date  . Colon polyp   . History of detached retina repair   . Hypothyroid   . Migraine   . Onychomycosis   . Osteopenia    Past Surgical History:  Procedure Laterality Date  . EYE SURGERY    . TUBAL LIGATION     Allergies  Allergen Reactions  . Sulfa Antibiotics   . Codeine    No current facility-administered medications on file prior to encounter.    Current Outpatient Medications on File Prior to Encounter  Medication Sig Dispense Refill  . b complex vitamins tablet Take 1 tablet by mouth daily.    Marland Kitchen levothyroxine (SYNTHROID, LEVOTHROID) 75 MCG tablet Take 1 tablet (75 mcg total) by mouth daily. 90 tablet 1    Social History   Socioeconomic History  . Marital status: Married    Spouse name: Not on file  . Number of children: Not on file  . Years of education: Not on file  . Highest education level: Not on file  Occupational History  . Occupation: Theme park manager  Social Needs  . Financial resource strain: Not on file  . Food insecurity:    Worry: Not on file    Inability: Not on file  . Transportation needs:    Medical: Not on file    Non-medical: Not on file  Tobacco Use  . Smoking status: Former Research scientist (life sciences)  . Smokeless tobacco: Never Used  Substance and Sexual Activity  .  Alcohol use: No  . Drug use: No  . Sexual activity: Not on file  Lifestyle  . Physical activity:    Days per week: Not on file    Minutes per session: Not on file  . Stress: Not on file  Relationships  . Social connections:    Talks on phone: Not on file    Gets together: Not on file    Attends religious service: Not on file    Active member of club or organization: Not on file    Attends meetings of clubs or organizations: Not on file    Relationship status: Not on file  . Intimate partner violence:    Fear of current or ex partner: Not on file    Emotionally abused: Not on file    Physically abused: Not on file    Forced sexual activity: Not on file  Other Topics Concern  . Not on file  Social History Narrative   Marital status: married   Children: 4 daughters and a son   Lives with: husband and son and daughter in law   Employment: yes   Tobacco:  past   Alcohol:  no   Drugs:  no   Exercise:  5 days a week to the gym, 3 days a week lifting weights   Seatbelt:  Guns in home:       Secured:   Family History  Problem Relation Age of Onset  . Alzheimer's disease Mother   . Breast cancer Mother   . Heart disease Father   . Cancer Daughter   . Cancer Maternal Grandmother        breast cancer  . Diabetes Maternal Grandmother   . Diabetes Maternal Grandfather      OBJECTIVE:  Vitals:   12/10/17 1154  BP: 122/88  Pulse: 81  Resp: 16  Temp: 97.9 F (36.6 C)  TempSrc: Oral  SpO2: 99%     General appearance: AOx3; appears fatigued HEENT: PERRL.  EOM grossly intact.  Sinuses nontender; no rhinorrhea; tonsils nonerythematous, uvula midline Neck: supple without LAD Lungs: clear to auscultation bilaterally without adventitious breath sounds; moderate harsh cough present Heart: regular rate and rhythm.  Radial pulses 2+ symmetrical bilaterally Skin: warm and dry Psychological: alert and cooperative; normal mood and affect  DIAGNOSTIC STUDIES:   Dg Chest 2  View  Result Date: 12/10/2017 CLINICAL DATA:  Cough, fever and sore throat for 1 week. EXAM: CHEST - 2 VIEW COMPARISON:  None. FINDINGS: The cardiac silhouette, mediastinal and hilar contours are within normal limits for age. There is moderate tortuosity of the thoracic aorta. The lungs demonstrate mild hyperinflation with probable changes of emphysema. No infiltrates, edema or effusions. The bony thorax is intact. IMPRESSION: Suspect emphysematous changes but no acute superimposed pulmonary process. Electronically Signed   By: Marijo Sanes M.D.   On: 12/10/2017 12:30    ASSESSMENT & PLAN:  1. Lower respiratory infection   2. Acute exacerbation of emphysema ([place])     Meds ordered this encounter  Medications  . azithromycin (ZITHROMAX) 250 MG tablet    Sig: Take 1 tablet (250 mg total) by mouth daily. Take first 2 tablets together, then 1 every day until finished.    Dispense:  6 tablet    Refill:  0    Order Specific Question:   Supervising Provider    Answer:   Wynona Luna [phone]  . predniSONE (DELTASONE) 20 MG tablet    Sig: Take 1 tablet (20 mg total) by mouth 2 (two) times daily with a meal for 5 days.    Dispense:  10 tablet    Refill:  0    Order Specific Question:   Supervising Provider    Answer:   Wynona Luna [phone]  . albuterol (PROVENTIL HFA;VENTOLIN HFA) 108 (90 Base) MCG/ACT inhaler 2 puff   Albuterol inhaler given.  Use as needed for shortness of breath or wheezing, or for bronchospasm Chest x-ray showed signs of COPD, but no signs of pneumonia We will treat you today for potential infectious process Get plenty of rest and push fluids Use OTC medication as needed for symptomatic relief Take antibiotic as directed and to completion Prescribed prednisone.  Take as directed and to completion  Follow up with PCP for further evaluation and management Return or go to ER if you have any new or worsening symptoms   Reviewed expectations re: course of  current medical issues. Questions answered. Outlined signs and symptoms indicating need for more acute intervention. Patient verbalized understanding. After Visit Summary given.          Lestine Box, PA-C 12/10/17 1335

## 2017-12-10 NOTE — Discharge Instructions (Addendum)
Albuterol inhaler given.  Use as needed for shortness of breath or wheezing, or for bronchospasm Chest x-ray showed signs of COPD, but no signs of pneumonia We will treat you today for potential infectious process Get plenty of rest and push fluids Use OTC medication as needed for symptomatic relief Take antibiotic as directed and to completion Prescribed prednisone.  Take as directed and to completion  Follow up with PCP for further evaluation and management Return or go to ER if you have any new or worsening symptoms

## 2017-12-13 ENCOUNTER — Telehealth: Payer: Self-pay | Admitting: Physician Assistant

## 2017-12-13 DIAGNOSIS — E039 Hypothyroidism, unspecified: Secondary | ICD-10-CM

## 2017-12-13 MED ORDER — LEVOTHYROXINE SODIUM 75 MCG PO TABS: 75.0000 ug | ORAL_TABLET | Freq: Every day | ORAL | 0 refills | Status: DC

## 2017-12-13 NOTE — Telephone Encounter (Signed)
Copied from [place] [id number]. Topic: Quick Communication - Rx Refill/Question >> Dec 13, 2017  2:43 PM Cecelia Byars, NT wrote: Medication:  levothyroxine (SYNTHROID, LEVOTHROID) 75 MCG tablet  Has the patient contacted their pharmacy? no: (Agent: If no, request that the patient contact the pharmacy for the refill. (Agent: If yes, when and what did the pharmacy advise?  Preferred Pharmacy (with phone number or street name  [facility] [place], [place] [phone] (Phone) [phone] (Fax) Patient has an appointment on 01/05/18   Agent: Please be advised that RX refills may take up to 3 business days. We ask that you follow-up with your pharmacy.

## 2017-12-13 NOTE — Telephone Encounter (Signed)
Pt called and pt states she would need a 30 day supply of medication to be sent to [facility] in order to have enough medication to last until appt on 01/05/18 with Dr. Pamella Pert. 30 day refill sent to requested pharmacy.

## 2017-12-28 ENCOUNTER — Other Ambulatory Visit: Payer: Self-pay

## 2017-12-28 ENCOUNTER — Encounter (HOSPITAL_COMMUNITY): Payer: Self-pay | Admitting: Family Medicine

## 2017-12-28 ENCOUNTER — Ambulatory Visit (HOSPITAL_COMMUNITY)
Admission: EM | Admit: 2017-12-28 | Discharge: 2017-12-28 | Disposition: A | Discharge status: Home / Self Care | Payer: Medicare HMO | Attending: Family Medicine | Admitting: Family Medicine

## 2017-12-28 DIAGNOSIS — Z79899 Other long term (current) drug therapy: Secondary | ICD-10-CM | POA: Diagnosis not present

## 2017-12-28 DIAGNOSIS — R69 Illness, unspecified: Secondary | ICD-10-CM | POA: Diagnosis not present

## 2017-12-28 DIAGNOSIS — Z8249 Family history of ischemic heart disease and other diseases of the circulatory system: Secondary | ICD-10-CM | POA: Insufficient documentation

## 2017-12-28 DIAGNOSIS — K219 Gastro-esophageal reflux disease without esophagitis: Secondary | ICD-10-CM | POA: Diagnosis not present

## 2017-12-28 DIAGNOSIS — R05 Cough: Secondary | ICD-10-CM | POA: Diagnosis present

## 2017-12-28 DIAGNOSIS — Z885 Allergy status to narcotic agent status: Secondary | ICD-10-CM | POA: Insufficient documentation

## 2017-12-28 DIAGNOSIS — G43909 Migraine, unspecified, not intractable, without status migrainosus: Secondary | ICD-10-CM | POA: Diagnosis not present

## 2017-12-28 DIAGNOSIS — Z882 Allergy status to sulfonamides status: Secondary | ICD-10-CM | POA: Insufficient documentation

## 2017-12-28 DIAGNOSIS — J40 Bronchitis, not specified as acute or chronic: Secondary | ICD-10-CM | POA: Diagnosis not present

## 2017-12-28 DIAGNOSIS — Z79890 Hormone replacement therapy (postmenopausal): Secondary | ICD-10-CM | POA: Diagnosis not present

## 2017-12-28 DIAGNOSIS — Z87891 Personal history of nicotine dependence: Secondary | ICD-10-CM | POA: Insufficient documentation

## 2017-12-28 DIAGNOSIS — M8580 Other specified disorders of bone density and structure, unspecified site: Secondary | ICD-10-CM | POA: Insufficient documentation

## 2017-12-28 DIAGNOSIS — E039 Hypothyroidism, unspecified: Secondary | ICD-10-CM | POA: Insufficient documentation

## 2017-12-28 DIAGNOSIS — J683 Other acute and subacute respiratory conditions due to chemicals, gases, fumes and vapors: Secondary | ICD-10-CM

## 2017-12-28 LAB — TSH: TSH: 5.174 u[IU]/mL — ABNORMAL HIGH (ref 0.350–4.500)

## 2017-12-28 MED ORDER — AEROCHAMBER MINI CHAMBER DEVI: 1.0000 | Freq: Four times a day (QID) | 1 refills | Status: DC

## 2017-12-28 MED ORDER — BENZONATATE 100 MG PO CAPS: 100.0000 mg | ORAL_CAPSULE | Freq: Three times a day (TID) | ORAL | 0 refills | Status: DC | PRN

## 2017-12-28 MED ORDER — ALBUTEROL SULFATE HFA 108 (90 BASE) MCG/ACT IN AERS: 2.0000 | INHALATION_SPRAY | RESPIRATORY_TRACT | 1 refills | Status: DC | PRN

## 2017-12-28 MED ORDER — IPRATROPIUM-ALBUTEROL 0.5-2.5 (3) MG/3ML IN SOLN
3.0000 mL | Freq: Once | RESPIRATORY_TRACT | Status: AC
  Administered 2017-12-28: 3 mL via RESPIRATORY_TRACT

## 2017-12-28 NOTE — Discharge Instructions (Addendum)
You do not have emphysema.  Instead, you have reactive airways that require a bronchodilator for at least a week.  If you are not improving as expected, please return or see your primary care doctor

## 2017-12-28 NOTE — ED Triage Notes (Signed)
Pt states she was here a x 1 week ago. Pt states she still has cough and cold.

## 2017-12-28 NOTE — ED Provider Notes (Signed)
[place]    CSN: [id number] Arrival date & time: 12/28/17  1502     History   Chief Complaint Chief Complaint  Patient presents with  . Cough    HPI Wanda Hernandez is a 77 y.o. female.   she was here a x 1 week ago. Pt states she still has cough and cold. Patient has been coughing and wheezing for over a month now.  She has had 2 doctor visits and after each 1 she seemed to get better.  Nevertheless, the symptoms came back.  She has a history of smoking in the remote past, having quit 35 years ago.  She has no history of asthma and has not had lung problems in the last 20 years.  She has had a fever at the onset of this illness and was treated with a Z-Pak.  She was also given steroids and an inhaler.  After discussing the use of the latter, it is clear that she does not use it correctly.  She does not like taking cough medicines that have any sugar in them.  The cough is uncontrollable, somewhat worse at night.   Note from last week at Urgent Care(9/15):  Wanda Hernandez is a 77 y.o. female previous smoker who presents with cough x 2 weeks.  Admits to positive sick exposure to son.  Describes cough as intermittent and productive with yellow/green sputum.  Has tried OTC medications like mucinex without relief.  Symptoms are made worse with laying down at night.  Reports previous symptoms in the past.  Complains of subjective fever, fatigue, sinus pressure, and rhinorrhea initially, but now symptoms resolved. Denies chills, sore throat, SOB, wheezing, chest pain, nausea, changes in bowel or bladder habits.       Past Medical History:  Diagnosis Date  . Colon polyp   . History of detached retina repair   . Hypothyroid   . Migraine   . Onychomycosis   . Osteopenia     Patient Active Problem List   Diagnosis Date Noted  . Detached retina 11/16/2011  . GERD (gastroesophageal reflux disease) 11/16/2011  . Migraine   . Osteopenia   . Hypothyroid      Past Surgical History:  Procedure Laterality Date  . EYE SURGERY    . TUBAL LIGATION      OB History    Gravida  5   Para  5   Term      Preterm  5   AB      Living  4     SAB      TAB      Ectopic      Multiple      Live Births               Home Medications    Prior to Admission medications   Medication Sig Start Date End Date Taking? Authorizing Provider  albuterol (PROVENTIL HFA;VENTOLIN HFA) 108 (90 Base) MCG/ACT inhaler Inhale 2 puffs into the lungs every 4 (four) hours as needed for wheezing or shortness of breath (cough, shortness of breath or wheezing.). 12/28/17   Robyn Haber, MD  b complex vitamins tablet Take 1 tablet by mouth daily.    [provider]  benzonatate (TESSALON) 100 MG capsule Take 1-2 capsules (100-200 mg total) by mouth 3 (three) times daily as needed for cough. 12/28/17   Robyn Haber, MD  levothyroxine (SYNTHROID, LEVOTHROID) 75 MCG tablet Take 1 tablet (75 mcg total) by  mouth daily. 12/13/17   Rutherford Guys, MD  Spacer/Aero-Holding Chambers (AEROCHAMBER MINI CHAMBER) DEVI 1 Device by Does not apply route every 6 (six) hours. 12/28/17   Robyn Haber, MD    Family History Family History  Problem Relation Age of Onset  . Alzheimer's disease Mother   . Breast cancer Mother   . Heart disease Father   . Cancer Daughter   . Cancer Maternal Grandmother        breast cancer  . Diabetes Maternal Grandmother   . Diabetes Maternal Grandfather     Social History Social History   Tobacco Use  . Smoking status: Former Research scientist (life sciences)  . Smokeless tobacco: Never Used  Substance Use Topics  . Alcohol use: No  . Drug use: No     Allergies   Sulfa antibiotics and Codeine   Review of Systems Review of Systems   Physical Exam Triage Vital Signs ED Triage Vitals  Enc Vitals Group     BP 12/28/17 1527 139/87     Pulse Rate 12/28/17 1527 76     Resp 12/28/17 1527 16     Temp 12/28/17 1527 98.6 F (37 C)      Temp Source 12/28/17 1527 Oral     SpO2 12/28/17 1527 99 %     Weight 12/28/17 1530 123 lb (55.8 kg)     Height --      Head Circumference --      Peak Flow --      Pain Score --      Pain Loc --      Pain Edu? --      Excl. in [place]? --    No data found.  Updated Vital Signs BP 139/87 (BP Location: Right Arm)   Pulse 76   Temp 98.6 F (37 C) (Oral)   Resp 16   Wt 55.8 kg   SpO2 99%   BMI 20.90 kg/m    Physical Exam  Constitutional: She is oriented to person, place, and time. She appears well-developed and well-nourished.  HENT:  Right Ear: External ear normal.  Left Ear: External ear normal.  Mouth/Throat: Oropharynx is clear and moist.  Eyes: Conjunctivae are normal.  Neck: Normal range of motion. Neck supple.  Pulmonary/Chest: Effort normal. She has wheezes.  Persistent cough after taking deep breaths.  Musculoskeletal: Normal range of motion.  Neurological: She is alert and oriented to person, place, and time.  Skin: Skin is warm and dry.  Nursing note and vitals reviewed.    UC Treatments / Results  Labs (all labs ordered are listed, but only abnormal results are displayed) Labs Reviewed - No data to display  EKG None  Radiology No results found.  Procedures Procedures (including critical care time)  Medications Ordered in UC Medications  ipratropium-albuterol (DUONEB) 0.5-2.5 (3) MG/3ML nebulizer solution 3 mL (3 mLs Nebulization Given 12/28/17 1604)    Initial Impression / Assessment and Plan / UC Course  I have reviewed the triage vital signs and the nursing notes.  Pertinent labs & imaging results that were available during my care of the patient were reviewed by me and considered in my medical decision making (see chart for details).   Final Clinical Impressions(s) / UC Diagnoses   Final diagnoses:  Bronchitis  Reactive airways dysfunction syndrome [facility])     Discharge Instructions     You do not have emphysema.  Instead, you have  reactive airways that require a bronchodilator for at least a week.  If you are not improving as expected, please return or see your primary care doctor    ED Prescriptions    Medication Sig Dispense Auth. Provider   benzonatate (TESSALON) 100 MG capsule Take 1-2 capsules (100-200 mg total) by mouth 3 (three) times daily as needed for cough. 40 capsule Robyn Haber, MD   albuterol (PROVENTIL HFA;VENTOLIN HFA) 108 (90 Base) MCG/ACT inhaler Inhale 2 puffs into the lungs every 4 (four) hours as needed for wheezing or shortness of breath (cough, shortness of breath or wheezing.). 1 Inhaler Robyn Haber, MD   Spacer/Aero-Holding Chambers (AEROCHAMBER MINI CHAMBER) DEVI 1 Device by Does not apply route every 6 (six) hours. Oblong, Gerrald Basu, MD     Controlled Substance Prescriptions [place] Controlled Substance Registry consulted? Not Applicable   Robyn Haber, MD 12/28/17 1630

## 2017-12-29 ENCOUNTER — Ambulatory Visit: Payer: Medicare HMO | Admitting: Family Medicine

## 2017-12-29 ENCOUNTER — Encounter

## 2018-01-01 ENCOUNTER — Telehealth (HOSPITAL_COMMUNITY): Payer: Self-pay

## 2018-01-02 ENCOUNTER — Ambulatory Visit (INDEPENDENT_AMBULATORY_CARE_PROVIDER_SITE_OTHER): Payer: Medicare HMO | Admitting: Family Medicine

## 2018-01-02 ENCOUNTER — Other Ambulatory Visit: Payer: Self-pay

## 2018-01-02 ENCOUNTER — Encounter: Payer: Self-pay | Admitting: Family Medicine

## 2018-01-02 ENCOUNTER — Ambulatory Visit (INDEPENDENT_AMBULATORY_CARE_PROVIDER_SITE_OTHER): Payer: Medicare HMO

## 2018-01-02 VITALS — BP 130/90 | HR 102 | Temp 99.7°F | Resp 16 | Ht 64.0 in | Wt 121.8 lb

## 2018-01-02 DIAGNOSIS — R053 Chronic cough: Secondary | ICD-10-CM

## 2018-01-02 DIAGNOSIS — R05 Cough: Secondary | ICD-10-CM | POA: Diagnosis not present

## 2018-01-02 DIAGNOSIS — J189 Pneumonia, unspecified organism: Secondary | ICD-10-CM

## 2018-01-02 DIAGNOSIS — E039 Hypothyroidism, unspecified: Secondary | ICD-10-CM | POA: Diagnosis not present

## 2018-01-02 LAB — POCT CBC
Granulocyte percent: 88.3 %G — AB (ref 37–80)
HCT, POC: 42.6 % (ref 37.7–47.9)
Hemoglobin: 14.5 g/dL (ref 12.2–16.2)
Lymph, poc: 0.7 (ref 0.6–3.4)
MCH, POC: 32.4 pg — AB (ref 27–31.2)
MCHC: 34 g/dL (ref 31.8–35.4)
MCV: 95.5 fL (ref 80–97)
MID (cbc): 0.3 (ref 0–0.9)
MPV: 7.4 fL (ref 0–99.8)
POC Granulocyte: 7.3 — AB (ref 2–6.9)
POC LYMPH PERCENT: 8.5 %L — AB (ref 10–50)
POC MID %: 3.2 %M (ref 0–12)
Platelet Count, POC: 237 10*3/uL (ref 142–424)
RBC: 4.46 M/uL (ref 4.04–5.48)
RDW, POC: 13.6 %
WBC: 8.3 10*3/uL (ref 4.6–10.2)

## 2018-01-02 MED ORDER — LEVOTHYROXINE SODIUM 88 MCG PO TABS: 88.0000 ug | ORAL_TABLET | Freq: Every day | ORAL | 0 refills | Status: AC

## 2018-01-02 MED ORDER — DOXYCYCLINE HYCLATE 100 MG PO TABS: 100.0000 mg | ORAL_TABLET | Freq: Two times a day (BID) | ORAL | 0 refills | Status: DC

## 2018-01-02 NOTE — Progress Notes (Signed)
Chief Complaint  Patient presents with  . Cough    per pt " I have been sick for 1 1/2 months, yellow to dark green mucus x 1 1/2 months. It started with a sore throat end of Sept. 30, 19  . wheezing  . Fever    last night 102.7 degree, I took Tylenol, two of 200 mg., "my blood pressure last night wa s149/99" it is not usually that high".    HPI   Cough Onset of cough 1.5 months ago November 24, 2017  She reports that she was seen at [facility] 12/10/17 and again 12/28/17 She states that she coughs incessantly She states that with the inhaler she felt better Last night she checked her temperature and her fever was 102 oral She has terrible coughing spells She states like she cannot breathe deeply She states that her blood pressure was also high overnight She states that she would like to get a work up for this  Hypothyroidism She reports that she has been taking her levothyroxine 74mcg The urgent care  checked her TSH She reports fatigue that she attributes to poor sleep and cough Lab Results  Component Value Date   TSH 5.174 (H) 12/28/2017    Past Medical History:  Diagnosis Date  . Colon polyp   . History of detached retina repair   . Hypothyroid   . Migraine   . Onychomycosis   . Osteopenia     Current Outpatient Medications  Medication Sig Dispense Refill  . albuterol (PROVENTIL HFA;VENTOLIN HFA) 108 (90 Base) MCG/ACT inhaler Inhale 2 puffs into the lungs every 4 (four) hours as needed for wheezing or shortness of breath (cough, shortness of breath or wheezing.). 1 Inhaler 1  . b complex vitamins tablet Take 1 tablet by mouth daily.    . benzonatate (TESSALON) 100 MG capsule Take 1-2 capsules (100-200 mg total) by mouth 3 (three) times daily as needed for cough. 40 capsule 0  . Fluticasone-Salmeterol (ADVAIR) 250-50 MCG/DOSE AEPB Inhale 1 puff into the lungs 2 (two) times daily.    Marland Kitchen levothyroxine (SYNTHROID, LEVOTHROID) 75 MCG tablet Take 1 tablet (75 mcg  total) by mouth daily. 30 tablet 0  . doxycycline (VIBRA-TABS) 100 MG tablet Take 1 tablet (100 mg total) by mouth 2 (two) times daily. 20 tablet 0  . Spacer/Aero-Holding Chambers (AEROCHAMBER MINI CHAMBER) DEVI 1 Device by Does not apply route every 6 (six) hours. (Patient not taking: Reported on 01/02/2018) 1 Device 1   No current facility-administered medications for this visit.     Allergies:  Allergies  Allergen Reactions  . Sulfa Antibiotics   . Codeine     Past Surgical History:  Procedure Laterality Date  . EYE SURGERY    . TUBAL LIGATION      Social History   Socioeconomic History  . Marital status: Married    Spouse name: Not on file  . Number of children: Not on file  . Years of education: Not on file  . Highest education level: Not on file  Occupational History  . Occupation: Theme park manager  Social Needs  . Financial resource strain: Not on file  . Food insecurity:    Worry: Not on file    Inability: Not on file  . Transportation needs:    Medical: Not on file    Non-medical: Not on file  Tobacco Use  . Smoking status: Former Research scientist (life sciences)  . Smokeless tobacco: Never Used  Substance and Sexual Activity  .  Alcohol use: No  . Drug use: No  . Sexual activity: Not on file  Lifestyle  . Physical activity:    Days per week: Not on file    Minutes per session: Not on file  . Stress: Not on file  Relationships  . Social connections:    Talks on phone: Not on file    Gets together: Not on file    Attends religious service: Not on file    Active member of club or organization: Not on file    Attends meetings of clubs or organizations: Not on file    Relationship status: Not on file  Other Topics Concern  . Not on file  Social History Narrative   Marital status: married   Children: 4 daughters and a son   Lives with: husband and son and daughter in law   Employment: yes   Tobacco:  past   Alcohol:  no   Drugs:  no   Exercise:  5 days a week to the gym, 3 days a  week lifting weights   Seatbelt:    Guns in home:       Secured:    Family History  Problem Relation Age of Onset  . Alzheimer's disease Mother   . Breast cancer Mother   . Heart disease Father   . Cancer Daughter   . Cancer Maternal Grandmother        breast cancer  . Diabetes Maternal Grandmother   . Diabetes Maternal Grandfather      ROS Review of Systems See HPI Constitution: No fevers or chills No malaise No diaphoresis Skin: No rash or itching Eyes: no blurry vision, no double vision GU: no dysuria or hematuria Neuro: no dizziness or headaches  all others reviewed and negative   Objective: Vitals:   01/02/18 1112 01/02/18 1118  BP: 130/90   Pulse: (!) 103 (!) 102  Resp: 16   Temp: 99.7 F (37.6 C)   TempSrc: Oral   SpO2: 91% 91%  Weight: 121 lb 12.8 oz (55.2 kg)   Height: 5\' 4"  (1.626 m)     Physical Exam   General: alert, oriented, in NAD Head: normocephalic, atraumatic, no sinus tenderness Eyes: EOM intact, no scleral icterus or conjunctival injection Ears: TM clear bilaterally Nose: mucosa nonerythematous, nonedematous Throat: no pharyngeal exudate or erythema Lymph: no posterior auricular, submental or cervical lymph adenopathy Heart: normal rate, normal sinus rhythm, no murmurs Lungs: clear to auscultation bilaterally, no wheezing   COMPARISON:  12/10/2017  FINDINGS:  On: 01/02/2018 11:58 Cardiomediastinal silhouette is normal. Mediastinal contours appear intact. Calcific atherosclerotic disease of the aorta.  There is no evidence of pleural effusion or pneumothorax. Bilateral lower lobe peribronchial patchy airspace consolidation.  Osseous structures are without acute abnormality. Soft tissues are grossly normal.  IMPRESSION: Bilateral lower lobe peribronchial patchy airspace consolidation concerning for pneumonia. Follow-up to resolution after empiric treatment is recommended.   Electronically Signed   By: Fidela Salisbury M.D.   EXAM:  On: 12/10/2017  CHEST - 2 VIEW  COMPARISON:  None.  FINDINGS: The cardiac silhouette, mediastinal and hilar contours are within normal limits for age. There is moderate tortuosity of the thoracic aorta. The lungs demonstrate mild hyperinflation with probable changes of emphysema. No infiltrates, edema or effusions. The bony thorax is intact.  IMPRESSION: Suspect emphysematous changes but no acute superimposed pulmonary process.   Electronically Signed   By: Marijo Sanes M.D.   Assessment and Plan Mickelle was seen  today for cough, wheezing and fever.  Diagnoses and all orders for this visit:  Persistent cough -     DG Chest 2 View; Future -     Ambulatory referral to Allergy -     Cancel: POCT CBC -     doxycycline (VIBRA-TABS) 100 MG tablet; Take 1 tablet (100 mg total) by mouth 2 (two) times daily.  Community acquired pneumonia, bilateral- will treat empirically with Doxycycline since pt already did a course of zpak She is sulfa allergic Doxycycline is broad spectrum   Acquired hypothyroidism- adjusted levothyroxine from 71mcg to 33mcg Discussed that she should take her medication on an empty stomach   Zoe A Nolon Rod

## 2018-01-02 NOTE — Patient Instructions (Addendum)
On: 01/02/2018 11:58  IMPRESSION: Bilateral lower lobe peribronchial patchy airspace consolidation concerning for pneumonia. Follow-up to resolution after empiric treatment is recommended.   Electronically Signed   By: Fidela Salisbury M.D.    Community-Acquired Pneumonia, Adult Pneumonia is an infection of the lungs. There are different types of pneumonia. One type can develop while a person is in a hospital. A different type, called community-acquired pneumonia, develops in people who are not, or have not recently been, in the hospital or other health care facility. What are the causes? Pneumonia may be caused by bacteria, viruses, or funguses. Community-acquired pneumonia is often caused by Streptococcus pneumonia bacteria. These bacteria are often passed from one person to another by breathing in droplets from the cough or sneeze of an infected person. What increases the risk? The condition is more likely to develop in:  People who havechronic diseases, such as chronic obstructive pulmonary disease (COPD), asthma, congestive heart failure, cystic fibrosis, diabetes, or kidney disease.  People who haveearly-stage or late-stage HIV.  People who havesickle cell disease.  People who havehad their spleen removed (splenectomy).  People who havepoor Human resources officer.  People who havemedical conditions that increase the risk of breathing in (aspirating) secretions their own mouth and nose.  People who havea weakened immune system (immunocompromised).  People who smoke.  People whotravel to areas where pneumonia-causing germs commonly exist.  People whoare around animal habitats or animals that have pneumonia-causing germs, including birds, bats, rabbits, cats, and farm animals.  What are the signs or symptoms? Symptoms of this condition include:  Adry cough.  A wet (productive) cough.  Fever.  Sweating.  Chest pain, especially when breathing deeply or  coughing.  Rapid breathing or difficulty breathing.  Shortness of breath.  Shaking chills.  Fatigue.  Muscle aches.  How is this diagnosed? Your health care provider will take a medical history and perform a physical exam. You may also have other tests, including:  Imaging studies of your chest, including X-rays.  Tests to check your blood oxygen level and other blood gases.  Other tests on blood, mucus (sputum), fluid around your lungs (pleural fluid), and urine.  If your pneumonia is severe, other tests may be done to identify the specific cause of your illness. How is this treated? The type of treatment that you receive depends on many factors, such as the cause of your pneumonia, the medicines you take, and other medical conditions that you have. For most adults, treatment and recovery from pneumonia may occur at home. In some cases, treatment must happen in a hospital. Treatment may include:  Antibiotic medicines, if the pneumonia was caused by bacteria.  Antiviral medicines, if the pneumonia was caused by a virus.  Medicines that are given by mouth or through an IV tube.  Oxygen.  Respiratory therapy.  Although rare, treating severe pneumonia may include:  Mechanical ventilation. This is done if you are not breathing well on your own and you cannot maintain a safe blood oxygen level.  Thoracentesis. This procedureremoves fluid around one lung or both lungs to help you breathe better.  Follow these instructions at home:  Take over-the-counter and prescription medicines only as told by your health care provider. ? Only takecough medicine if you are losing sleep. Understand that cough medicine can prevent your body's natural ability to remove mucus from your lungs. ? If you were prescribed an antibiotic medicine, take it as told by your health care provider. Do not stop taking the antibiotic even  if you start to feel better.  Sleep in a semi-upright position at  night. Try sleeping in a reclining chair, or place a few pillows under your head.  Do not use tobacco products, including cigarettes, chewing tobacco, and e-cigarettes. If you need help quitting, ask your health care provider.  Drink enough water to keep your urine clear or pale yellow. This will help to thin out mucus secretions in your lungs. How is this prevented? There are ways that you can decrease your risk of developing community-acquired pneumonia. Consider getting a pneumococcal vaccine if:  You are older than 78 years of age.  You are older than 78 years of age and are undergoing cancer treatment, have chronic lung disease, or have other medical conditions that affect your immune system. Ask your health care provider if this applies to you.  There are different types and schedules of pneumococcal vaccines. Ask your health care provider which vaccination option is best for you. You may also prevent community-acquired pneumonia if you take these actions:  Get an influenza vaccine every year. Ask your health care provider which type of influenza vaccine is best for you.  Go to the dentist on a regular basis.  Wash your hands often. Use hand sanitizer if soap and water are not available.  Contact a health care provider if:  You have a fever.  You are losing sleep because you cannot control your cough with cough medicine. Get help right away if:  You have worsening shortness of breath.  You have increased chest pain.  Your sickness becomes worse, especially if you are an older adult or have a weakened immune system.  You cough up blood. This information is not intended to replace advice given to you by your health care provider. Make sure you discuss any questions you have with your health care provider. Document Released: 03/14/2005 Document Revised: 07/23/2015 Document Reviewed: 07/09/2014 Elsevier Interactive Patient Education  [employer].

## 2018-01-03 ENCOUNTER — Telehealth: Payer: Self-pay | Admitting: Family Medicine

## 2018-01-03 ENCOUNTER — Telehealth: Payer: Self-pay | Admitting: General Practice

## 2018-01-03 ENCOUNTER — Telehealth: Payer: Self-pay

## 2018-01-03 NOTE — Telephone Encounter (Signed)
Copied from [facility] [id number]. Topic: General - Other >> Jan 03, 2018 12:28 PM Keene Breath wrote: Reason for CRM: Patient's daughter called to request a referral for a pulmonologist for her mother.  She has gotten worse since her doctor's visit and the doctor said that she might need a referral to see a specialist.  Patient is ready to see the specialist as soon as possible.  CB# [phone]

## 2018-01-03 NOTE — Telephone Encounter (Signed)
Patient daughter Rene Kocher called back stated that she spoke with someone at [facility] [phone] Fax# [phone]  who has an appointment for tomorrow 01/04/18 and is willing to see the patient. Rene Kocher request a call back today  Ph# [phone]

## 2018-01-03 NOTE — Telephone Encounter (Signed)
Daughter call to office re pulmonary referral.  Has appt 10/10 at 1:00 but need referral entered.  States should not have been allergy referral.  Message sent to provider and given to [place].

## 2018-01-03 NOTE — Telephone Encounter (Signed)
Pt's daughter, Juliann Pulse calling. States she needs to speak with referral people ASAP on this matter of referral. Juliann Pulse can be reached at [phone].

## 2018-01-03 NOTE — Telephone Encounter (Signed)
Please advise  Copied from [facility] [id number]. Topic: General - Other >> Jan 03, 2018 10:05 AM Leward Quan A wrote: Reason for CRM: Patient called to request a referral to se a Pulmonologist asking for this referral by the weekend. States that she have Pneumonia and that the cough is persistent. Request a call back today. Please advise Ph# [phone]

## 2018-01-04 ENCOUNTER — Emergency Department (HOSPITAL_COMMUNITY)
Admission: EM | Admit: 2018-01-04 | Discharge: 2018-01-04 | Disposition: A | Discharge status: Home / Self Care | Payer: Medicare HMO | Attending: Emergency Medicine | Admitting: Emergency Medicine

## 2018-01-04 ENCOUNTER — Emergency Department (HOSPITAL_COMMUNITY): Payer: Medicare HMO

## 2018-01-04 ENCOUNTER — Encounter (HOSPITAL_COMMUNITY): Payer: Self-pay

## 2018-01-04 DIAGNOSIS — R0600 Dyspnea, unspecified: Secondary | ICD-10-CM | POA: Diagnosis not present

## 2018-01-04 DIAGNOSIS — R05 Cough: Secondary | ICD-10-CM | POA: Insufficient documentation

## 2018-01-04 DIAGNOSIS — E039 Hypothyroidism, unspecified: Secondary | ICD-10-CM | POA: Insufficient documentation

## 2018-01-04 DIAGNOSIS — J181 Lobar pneumonia, unspecified organism: Secondary | ICD-10-CM | POA: Diagnosis not present

## 2018-01-04 DIAGNOSIS — Z87891 Personal history of nicotine dependence: Secondary | ICD-10-CM | POA: Insufficient documentation

## 2018-01-04 DIAGNOSIS — J189 Pneumonia, unspecified organism: Secondary | ICD-10-CM | POA: Diagnosis not present

## 2018-01-04 DIAGNOSIS — J45909 Unspecified asthma, uncomplicated: Secondary | ICD-10-CM | POA: Diagnosis not present

## 2018-01-04 DIAGNOSIS — I712 Thoracic aortic aneurysm, without rupture: Secondary | ICD-10-CM | POA: Insufficient documentation

## 2018-01-04 DIAGNOSIS — J90 Pleural effusion, not elsewhere classified: Secondary | ICD-10-CM | POA: Diagnosis not present

## 2018-01-04 DIAGNOSIS — R918 Other nonspecific abnormal finding of lung field: Secondary | ICD-10-CM | POA: Diagnosis not present

## 2018-01-04 DIAGNOSIS — R9389 Abnormal findings on diagnostic imaging of other specified body structures: Secondary | ICD-10-CM | POA: Diagnosis not present

## 2018-01-04 DIAGNOSIS — Z79899 Other long term (current) drug therapy: Secondary | ICD-10-CM | POA: Diagnosis not present

## 2018-01-04 DIAGNOSIS — D72829 Elevated white blood cell count, unspecified: Secondary | ICD-10-CM | POA: Diagnosis not present

## 2018-01-04 DIAGNOSIS — R1011 Right upper quadrant pain: Secondary | ICD-10-CM | POA: Insufficient documentation

## 2018-01-04 DIAGNOSIS — R053 Chronic cough: Secondary | ICD-10-CM

## 2018-01-04 DIAGNOSIS — J029 Acute pharyngitis, unspecified: Secondary | ICD-10-CM | POA: Diagnosis not present

## 2018-01-04 LAB — CBC WITH DIFFERENTIAL/PLATELET
Abs Immature Granulocytes: 0.05 10*3/uL (ref 0.00–0.07)
Basophils Absolute: 0 10*3/uL (ref 0.0–0.1)
Basophils Relative: 0 %
Eosinophils Absolute: 0.3 10*3/uL (ref 0.0–0.5)
Eosinophils Relative: 4 %
HCT: 41.1 % (ref 36.0–46.0)
Hemoglobin: 13.8 g/dL (ref 12.0–15.0)
Immature Granulocytes: 1 %
Lymphocytes Relative: 11 %
Lymphs Abs: 0.8 10*3/uL (ref 0.7–4.0)
MCH: 32.2 pg (ref 26.0–34.0)
MCHC: 33.6 g/dL (ref 30.0–36.0)
MCV: 95.8 fL (ref 80.0–100.0)
Monocytes Absolute: 0.5 10*3/uL (ref 0.1–1.0)
Monocytes Relative: 7 %
Neutro Abs: 5.6 10*3/uL (ref 1.7–7.7)
Neutrophils Relative %: 77 %
Platelets: 244 10*3/uL (ref 150–400)
RBC: 4.29 MIL/uL (ref 3.87–5.11)
RDW: 13.1 % (ref 11.5–15.5)
WBC: 7.3 10*3/uL (ref 4.0–10.5)
nRBC: 0 % (ref 0.0–0.2)

## 2018-01-04 LAB — BRAIN NATRIURETIC PEPTIDE: B Natriuretic Peptide: 61.2 pg/mL (ref 0.0–100.0)

## 2018-01-04 LAB — COMPREHENSIVE METABOLIC PANEL
ALT: 28 U/L (ref 0–44)
AST: 20 U/L (ref 15–41)
Albumin: 3.2 g/dL — ABNORMAL LOW (ref 3.5–5.0)
Alkaline Phosphatase: 159 U/L — ABNORMAL HIGH (ref 38–126)
Anion gap: 11 (ref 5–15)
BUN: 16 mg/dL (ref 8–23)
CO2: 23 mmol/L (ref 22–32)
Calcium: 9.3 mg/dL (ref 8.9–10.3)
Chloride: 104 mmol/L (ref 98–111)
Creatinine, Ser: 0.66 mg/dL (ref 0.44–1.00)
GFR calc Af Amer: >60 mL/min (ref >60)
GFR calc non Af Amer: >60 mL/min (ref >60)
Glucose, Bld: 103 mg/dL — ABNORMAL HIGH (ref 70–99)
Potassium: 3.5 mmol/L (ref 3.5–5.1)
Sodium: 138 mmol/L (ref 135–145)
Total Bilirubin: 0.6 mg/dL (ref 0.3–1.2)
Total Protein: 6.4 g/dL — ABNORMAL LOW (ref 6.5–8.1)

## 2018-01-04 LAB — SEDIMENTATION RATE: Sed Rate: 108 mm/hr — ABNORMAL HIGH (ref 0–22)

## 2018-01-04 LAB — C-REACTIVE PROTEIN: CRP: 22.1 mg/dL — ABNORMAL HIGH (ref <1.0)

## 2018-01-04 MED ORDER — ONDANSETRON 4 MG PO TBDP: 4.0000 mg | ORAL_TABLET | Freq: Three times a day (TID) | ORAL | 0 refills | Status: DC | PRN

## 2018-01-04 MED ORDER — IOHEXOL 300 MG/ML  SOLN
75.0000 mL | Freq: Once | INTRAMUSCULAR | Status: AC
  Administered 2018-01-04: 75 mL via INTRAVENOUS

## 2018-01-04 MED ORDER — LEVOFLOXACIN 750 MG PO TABS: 750.0000 mg | ORAL_TABLET | Freq: Every day | ORAL | 0 refills | Status: DC

## 2018-01-04 MED ORDER — ONDANSETRON 4 MG PO TBDP
4.0000 mg | ORAL_TABLET | Freq: Once | ORAL | Status: AC
  Administered 2018-01-04: 4 mg via ORAL
  Filled 2018-01-04: qty 1

## 2018-01-04 MED ORDER — ALUM & MAG HYDROXIDE-SIMETH 200-200-20 MG/5ML PO SUSP
15.0000 mL | Freq: Once | ORAL | Status: AC
  Administered 2018-01-04: 15 mL via ORAL
  Filled 2018-01-04: qty 30

## 2018-01-04 MED ORDER — SODIUM CHLORIDE 0.9 % IV SOLN: 1000.0000 mL | INTRAVENOUS | Status: DC

## 2018-01-04 MED ORDER — SODIUM CHLORIDE 0.9 % IV BOLUS (SEPSIS)
1000.0000 mL | Freq: Once | INTRAVENOUS | Status: AC
  Administered 2018-01-04: 1000 mL via INTRAVENOUS

## 2018-01-04 NOTE — ED Provider Notes (Signed)
[facility] EMERGENCY DEPARTMENT Provider Note   CSN: [id number] Arrival date & time: 01/04/18  1246     History   Chief Complaint Chief Complaint  Patient presents with  . Cough    HPI ONEIKA SIMONIAN is a 77 y.o. female.  HPI Has had a cough that has persisted for almost a month and a half.  She reports that it has gotten worse.  Earlier on in the illness she was treated with Z-Pak.  Reports that she temporarily got better but then when it came back again it was worse.  She reports now if she lies down she coughs all the time.  Cough is fairly harsh.  She reports she thought she had a fever early on in the illness but did not measure it.  No chest pain.  She does think she is lost about 3 pounds.  No Lower extremity swelling.  Patient has not had recent travel.  The past several years she has been to [place] in [place].  She also flew to [place].  None of this has been within the past 6 months.  Patient was seen at urgent care today and there were concerning findings on chest x-ray.  She was referred to the emergency department for CT and further diagnostic evaluation. Past Medical History:  Diagnosis Date  . Colon polyp   . History of detached retina repair   . Hypothyroid   . Migraine   . Onychomycosis   . Osteopenia     Patient Active Problem List   Diagnosis Date Noted  . Detached retina 11/16/2011  . GERD (gastroesophageal reflux disease) 11/16/2011  . Migraine   . Osteopenia   . Hypothyroid     Past Surgical History:  Procedure Laterality Date  . EYE SURGERY    . TUBAL LIGATION       OB History    Gravida  5   Para  5   Term      Preterm  5   AB      Living  4     SAB      TAB      Ectopic      Multiple      Live Births               Home Medications    Prior to Admission medications   Medication Sig Start Date End Date Taking? Authorizing Provider  albuterol (PROVENTIL HFA;VENTOLIN HFA) 108 (90  Base) MCG/ACT inhaler Inhale 2 puffs into the lungs every 4 (four) hours as needed for wheezing or shortness of breath (cough, shortness of breath or wheezing.). 12/28/17   Robyn Haber, MD  b complex vitamins tablet Take 1 tablet by mouth daily.    [provider]  benzonatate (TESSALON) 100 MG capsule Take 1-2 capsules (100-200 mg total) by mouth 3 (three) times daily as needed for cough. 12/28/17   Robyn Haber, MD  doxycycline (VIBRA-TABS) 100 MG tablet Take 1 tablet (100 mg total) by mouth 2 (two) times daily. 01/02/18   Forrest Moron, MD  Fluticasone-Salmeterol (ADVAIR) 250-50 MCG/DOSE AEPB Inhale 1 puff into the lungs 2 (two) times daily.    [provider]  levothyroxine (SYNTHROID, LEVOTHROID) 88 MCG tablet Take 1 tablet (88 mcg total) by mouth daily. 01/02/18   Forrest Moron, MD  Spacer/Aero-Holding Chambers (AEROCHAMBER MINI CHAMBER) DEVI 1 Device by Does not apply route every 6 (six) hours. Patient not taking: Reported on 01/02/2018  12/28/17   Robyn Haber, MD    Family History Family History  Problem Relation Age of Onset  . Alzheimer's disease Mother   . Breast cancer Mother   . Heart disease Father   . Cancer Daughter   . Cancer Maternal Grandmother        breast cancer  . Diabetes Maternal Grandmother   . Diabetes Maternal Grandfather     Social History Social History   Tobacco Use  . Smoking status: Former Research scientist (life sciences)  . Smokeless tobacco: Never Used  Substance Use Topics  . Alcohol use: No  . Drug use: No     Allergies   Sulfa antibiotics and Codeine   Review of Systems Review of Systems 10 Systems reviewed and are negative for acute change except as noted in the HPI.   Physical Exam Updated Vital Signs BP (!) 142/98   Pulse (!) 101   Temp 98.3 F (36.8 C) (Oral)   Resp (!) 23   SpO2 97%   Physical Exam  Constitutional: She is oriented to person, place, and time. She appears well-developed and well-nourished. No  distress.  HENT:  Head: Normocephalic and atraumatic.  Mouth/Throat: Oropharynx is clear and moist.  Eyes: EOM are normal.  Neck: Neck supple.  Cardiovascular: Normal rate, regular rhythm, normal heart sounds and intact distal pulses.  Pulmonary/Chest: Effort normal.  Breath sounds slightly diminished at the bases.  No gross wheeze rhonchi or rale.  Patient does develop cough paroxysmal with deep inspiration.  Abdominal: Soft. She exhibits no distension. There is no tenderness.  Musculoskeletal: Normal range of motion. She exhibits no edema or tenderness.  Lymphadenopathy:    She has no cervical adenopathy.  Neurological: She is alert and oriented to person, place, and time. No cranial nerve deficit. She exhibits normal muscle tone. Coordination normal.  Skin: Skin is warm and dry.  Psychiatric: She has a normal mood and affect.     ED Treatments / Results  Labs (all labs ordered are listed, but only abnormal results are displayed) Labs Reviewed  COMPREHENSIVE METABOLIC PANEL - Abnormal; Notable for the following components:      Result Value   Glucose, Bld 103 (*)    Total Protein 6.4 (*)    Albumin 3.2 (*)    Alkaline Phosphatase 159 (*)    All other components within normal limits  BRAIN NATRIURETIC PEPTIDE  CBC WITH DIFFERENTIAL/PLATELET  URINALYSIS, ROUTINE W REFLEX MICROSCOPIC  SEDIMENTATION RATE  C-REACTIVE PROTEIN    EKG None  Radiology No results found.  Procedures Procedures (including critical care time)  Medications Ordered in ED Medications  sodium chloride 0.9 % bolus 1,000 mL (1,000 mLs Intravenous New Bag/Given 01/04/18 1437)    Followed by  0.9 %  sodium chloride infusion (has no administration in time range)     Initial Impression / Assessment and Plan / ED Course  I have reviewed the triage vital signs and the nursing notes.  Pertinent labs & imaging results that were available during my care of the patient were reviewed by me and considered  in my medical decision making (see chart for details).    Plan will be to proceed with CT of the chest.  Pending results, Dr. Tyrone Nine will evaluate for final disposition.  Patient's clinical appearance is well.  She does not have any respiratory distress.  She is alert and nontoxic.  Final Clinical Impressions(s) / ED Diagnoses   Final diagnoses:  Chronic cough  Abnormal chest x-ray  ED Discharge Orders    None       Charlesetta Shanks, MD 01/04/18 1600

## 2018-01-04 NOTE — Telephone Encounter (Signed)
Done and already spoke with pt daughter

## 2018-01-04 NOTE — ED Provider Notes (Signed)
77 yo F that I received in signout from Dr. Vallery Ridge.  Briefly the patient presented with a month of cough.  She has been feeling somewhat fatigued.  She had an abnormal chest x-ray that was concerning for a pleural effusion and she was sent here for a CT scan.  Plan is to potentially discharge the patient if she continues to be well-appearing on repeat evaluation post CT.  CT scan shows no pleural effusion she has a right lower lobe pneumonia.  I discussed the results with the patient and she unfortunately was in some distress.  She stated after the contrast load she had sudden right upper quadrant pain and cramping.  She had some nausea but denied vomiting.  My exam the patient has some right upper quadrant tenderness and a positive Murphy sign.  I discussed possible imaging including CT versus right upper quadrant ultrasound.  At this time the patient feels is most likely gas.  She would like to take Tums and Maalox and then be reassessed.  I reassessed the patient and she is feeling much better she is requesting discharge home.  She will return for any worsening symptoms.  Per Dr. Vallery Ridge the plan was to start her on Levaquin and have her follow-up with a pulmonologist.  7:04 PM:  I have discussed the diagnosis/risks/treatment options with the patient and family and believe the pt to be eligible for discharge home to follow-up with PCP, pulm. We also discussed returning to the ED immediately if new or worsening sx occur. We discussed the sx which are most concerning (e.g., sudden worsening pain, fever, inability to tolerate by mouth) that necessitate immediate return. Medications administered to the patient during their visit and any new prescriptions provided to the patient are listed below.  Medications given during this visit Medications  sodium chloride 0.9 % bolus 1,000 mL (0 mLs Intravenous Stopped 01/04/18 1759)    Followed by  0.9 %  sodium chloride infusion (has no administration in time range)   iohexol (OMNIPAQUE) 300 MG/ML solution 75 mL (75 mLs Intravenous Contrast Given 01/04/18 1643)  alum & mag hydroxide-simeth (MAALOX/MYLANTA) 200-200-20 MG/5ML suspension 15 mL (15 mLs Oral Given 01/04/18 1753)  ondansetron (ZOFRAN-ODT) disintegrating tablet 4 mg (4 mg Oral Given 01/04/18 1753)      The patient appears reasonably screen and/or stabilized for discharge and I doubt any other medical condition or other [facility] requiring further screening, evaluation, or treatment in the ED at this time prior to discharge.     Deno Etienne, DO 01/04/18 1904

## 2018-01-04 NOTE — Addendum Note (Signed)
Addended by: Delia Chimes A on: 01/04/2018 09:16 AM   Modules accepted: Orders

## 2018-01-04 NOTE — Discharge Instructions (Signed)
Follow up with the pulmonologist and your PCP.

## 2018-01-04 NOTE — ED Triage Notes (Signed)
Pt presents from [facility] in [place], [place] for evaluation of ongoing cough. Had chest xray and was told she needed to come here for chest CT and ultrasound guided thoracentesis.

## 2018-01-04 NOTE — Telephone Encounter (Signed)
Message sent to referrals as urgent matter.  Referral to pulmonary placed today.  Please follow up on as ASAp/Urgent. Thank you.

## 2018-01-05 ENCOUNTER — Other Ambulatory Visit (HOSPITAL_COMMUNITY): Payer: Self-pay | Admitting: Emergency Medicine

## 2018-01-05 ENCOUNTER — Ambulatory Visit: Payer: Medicare HMO | Admitting: Family Medicine

## 2018-01-05 DIAGNOSIS — J90 Pleural effusion, not elsewhere classified: Secondary | ICD-10-CM

## 2018-01-18 ENCOUNTER — Encounter: Payer: Self-pay | Admitting: Pulmonary Disease

## 2018-01-18 ENCOUNTER — Ambulatory Visit: Payer: Medicare HMO | Admitting: Pulmonary Disease

## 2018-01-18 VITALS — BP 110/78 | HR 70 | Ht 64.0 in | Wt 122.6 lb

## 2018-01-18 DIAGNOSIS — Z8701 Personal history of pneumonia (recurrent): Secondary | ICD-10-CM | POA: Diagnosis not present

## 2018-01-18 DIAGNOSIS — I712 Thoracic aortic aneurysm, without rupture: Secondary | ICD-10-CM

## 2018-01-18 DIAGNOSIS — R059 Cough, unspecified: Secondary | ICD-10-CM

## 2018-01-18 DIAGNOSIS — I7121 Aneurysm of the ascending aorta, without rupture: Secondary | ICD-10-CM

## 2018-01-18 DIAGNOSIS — R05 Cough: Secondary | ICD-10-CM | POA: Diagnosis not present

## 2018-01-18 NOTE — Patient Instructions (Addendum)
Thank you for visiting Dr. Valeta Harms at [facility]. Today we recommend the following: Orders Placed This Encounter  Procedures  . DG Chest 2 View   Return if symptoms worsen or fail to improve.  We are moving our office in November. The new address will be: [address] [employer] [facility] Phone: [phone]

## 2018-01-18 NOTE — Progress Notes (Signed)
Synopsis: Referred in Oct 2019 for hx of pneumonia by Forrest Moron, MD  Subjective:   PATIENT ID: Wanda Hernandez GENDER: female DOB: [birth year], MRN: [id number]  Chief Complaint  Patient presents with  . Consult    States she recently had PNE in August, finished Antibiotic last friday.    PMH colon polyps, migraine. She sees a PCP at [facility]. She lives in [place]. She was diagnosed with pneumonia. She was treated with three different rounds. She finally completed a course of levaquin.  She is a former smoker, quit 38 years ago, only smoked for approximately 10 years, less than half pack per day.  She currently lives in [place] she is a Theme park manager.  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  She has been doing well since the past week or so.  She has actually went back to the station and able to record her first session in the past 6 weeks.  She has a daughter who is a PA and son-in-law who is a Hydrographic surveyor in [place].  She was seen in the emergency room after having a CAT scan done after being followed up by her PCP with concern of evaluation for a potential pleural effusion or complication related to her recent pneumonia.  The CT scan revealed a consolidation within the right lower lobe with no evidence of pleural effusion.  She completed a final course of Levaquin and has otherwise been doing well.    Past Medical History:  Diagnosis Date  . Colon polyp   . History of detached retina repair   . Hypothyroid   . Migraine   . Onychomycosis   . Osteopenia      Family History  Problem Relation Age of Onset  . Alzheimer's disease Mother   . Breast cancer Mother   . Heart disease Father   . Cancer Daughter   . Cancer Maternal Grandmother        breast cancer  . Diabetes Maternal Grandmother   . Diabetes Maternal Grandfather      Past Surgical History:  Procedure Laterality Date  . EYE  SURGERY    . TUBAL LIGATION      Social History   Socioeconomic History  . Marital status: Married    Spouse name: Not on file  . Number of children: Not on file  . Years of education: Not on file  . Highest education level: Not on file  Occupational History  . Occupation: Theme park manager  Social Needs  . Financial resource strain: Not on file  . Food insecurity:    Worry: Not on file    Inability: Not on file  . Transportation needs:    Medical: Not on file    Non-medical: Not on file  Tobacco Use  . Smoking status: Former Research scientist (life sciences)  . Smokeless tobacco: Never Used  Substance and Sexual Activity  . Alcohol use: No  . Drug use: No  . Sexual activity: Not on file  Lifestyle  . Physical activity:    Days per week: Not on file    Minutes per session: Not on file  . Stress: Not on file  Relationships  . Social connections:    Talks on phone: Not on file    Gets together: Not on file    Attends religious service: Not on file    Active member of club or organization: Not on file    Attends meetings  of clubs or organizations: Not on file    Relationship status: Not on file  . Intimate partner violence:    Fear of current or ex partner: Not on file    Emotionally abused: Not on file    Physically abused: Not on file    Forced sexual activity: Not on file  Other Topics Concern  . Not on file  Social History Narrative   Marital status: married   Children: 4 daughters and a son   Lives with: husband and son and daughter in law   Employment: yes   Tobacco:  past   Alcohol:  no   Drugs:  no   Exercise:  5 days a week to the gym, 3 days a week lifting weights   Seatbelt:    Guns in home:       Secured:     Allergies  Allergen Reactions  . Sulfa Antibiotics Other (See Comments)    Childhood reaction pt went into a coma  . Codeine      Outpatient Medications Prior to Visit  Medication Sig Dispense Refill  . albuterol (PROVENTIL HFA;VENTOLIN HFA) 108 (90 Base) MCG/ACT  inhaler Inhale 2 puffs into the lungs every 4 (four) hours as needed for wheezing or shortness of breath (cough, shortness of breath or wheezing.). 1 Inhaler 1  . b complex vitamins tablet Take 1 tablet by mouth daily.    Marland Kitchen levothyroxine (SYNTHROID, LEVOTHROID) 88 MCG tablet Take 1 tablet (88 mcg total) by mouth daily. 90 tablet 0  . ondansetron (ZOFRAN ODT) 4 MG disintegrating tablet Take 1 tablet (4 mg total) by mouth every 8 (eight) hours as needed for nausea or vomiting. 20 tablet 0  . Spacer/Aero-Holding Chambers (AEROCHAMBER MINI CHAMBER) DEVI 1 Device by Does not apply route every 6 (six) hours. 1 Device 1  . Fluticasone-Salmeterol (ADVAIR) 250-50 MCG/DOSE AEPB Inhale 1 puff into the lungs 2 (two) times daily.    . benzonatate (TESSALON) 100 MG capsule Take 1-2 capsules (100-200 mg total) by mouth 3 (three) times daily as needed for cough. (Patient not taking: Reported on 01/18/2018) 40 capsule 0  . doxycycline (VIBRA-TABS) 100 MG tablet Take 1 tablet (100 mg total) by mouth 2 (two) times daily. (Patient not taking: Reported on 01/18/2018) 20 tablet 0  . levofloxacin (LEVAQUIN) 750 MG tablet Take 1 tablet (750 mg total) by mouth daily. X 7 days (Patient not taking: Reported on 01/18/2018) 5 tablet 0   No facility-administered medications prior to visit.     Review of Systems  Constitutional: Negative.   HENT: Negative.   Eyes: Negative.   Respiratory: Negative.   Cardiovascular: Negative.   Gastrointestinal: Negative.   Genitourinary: Negative.   Musculoskeletal: Negative.   Skin: Negative.   Neurological: Negative.   Endo/Heme/Allergies: Negative.   Psychiatric/Behavioral: Negative.      Objective:  Physical Exam  Constitutional: She is oriented to person, place, and time. She appears well-developed and well-nourished. No distress.  HENT:  Head: Normocephalic and atraumatic.  Mouth/Throat: Oropharynx is clear and moist.  Eyes: Pupils are equal, round, and reactive to light.  Conjunctivae are normal. No scleral icterus.  Neck: Neck supple. No JVD present. No tracheal deviation present.  Cardiovascular: Normal rate, regular rhythm, normal heart sounds and intact distal pulses.  No murmur heard. Pulmonary/Chest: Effort normal and breath sounds normal. No accessory muscle usage or stridor. No tachypnea. No respiratory distress. She has no wheezes. She has no rhonchi. She has no rales.  Abdominal:  Soft. Bowel sounds are normal. She exhibits no distension. There is no tenderness.  Musculoskeletal: She exhibits no edema or tenderness.  Lymphadenopathy:    She has no cervical adenopathy.  Neurological: She is alert and oriented to person, place, and time.  Skin: Skin is warm and dry. Capillary refill takes less than 2 seconds. No rash noted.  Psychiatric: She has a normal mood and affect. Her behavior is normal.  Vitals reviewed.   Vitals:   01/18/18 1415  BP: 110/78  Pulse: 70  SpO2: 97%  Weight: 122 lb 9.6 oz (55.6 kg)  Height: 5\' 4"  (1.626 m)   97% on RA BMI Readings from Last 3 Encounters:  01/18/18 21.04 kg/m  01/02/18 20.91 kg/m  12/28/17 20.90 kg/m   Wt Readings from Last 3 Encounters:  01/18/18 122 lb 9.6 oz (55.6 kg)  01/02/18 121 lb 12.8 oz (55.2 kg)  12/28/17 123 lb (55.8 kg)     CBC    Component Value Date/Time   WBC 7.3 01/04/2018 1435   RBC 4.29 01/04/2018 1435   HGB 13.8 01/04/2018 1435   HGB 15.4 06/17/2017 1621   HCT 41.1 01/04/2018 1435   HCT 45.5 06/17/2017 1621   PLT 244 01/04/2018 1435   PLT 207 06/17/2017 1621   MCV 95.8 01/04/2018 1435   MCV 95.5 01/02/2018 1212   MCV 95 06/17/2017 1621   MCH 32.2 01/04/2018 1435   MCHC 33.6 01/04/2018 1435   RDW 13.1 01/04/2018 1435   RDW 14.4 06/17/2017 1621   LYMPHSABS 0.8 01/04/2018 1435   MONOABS 0.5 01/04/2018 1435   EOSABS 0.3 01/04/2018 1435   BASOSABS 0.0 01/04/2018 1435    Chest Imaging: CXR and CT imaging: CT imaging with right lower lobe consolidation.  I does  appear to be improving from prior chest x-ray imaging. Images reviewed with the patient in the chart today. The patient's images have been independently reviewed by me.    Pulmonary Functions Testing Results: None   FeNO: None   Pathology: None   Echocardiogram: None   Heart Catheterization: None     Assessment & Plan:   Hx of bacterial pneumonia - Plan: DG Chest 2 View  Cough  Thoracic ascending aortic aneurysm [facility])  Discussion:  This is a 77 year old female with a history of right lower lobe pneumonia.  She has been treated for this and over the past 6 weeks has slowly been improving.  She is here for evaluation after having pneumonia.  At this point she has been doing well and has no respiratory complaints.  I did recommend that she have the pneumonia vaccine as well as a flu shot for this year.  She is not quite ready for having a vaccine at this time and has not had them in the past.  But she did promise that she was going to talk to her primary care doctor about having it done this year.  I recommend that she increased her exercise regimen and tolerance.  To build her endurance and back to the strength where she was operating it before.  She also has a a sending thoracic aneurysm that will need to be followed up with a vascular or thoracic vascular surgeon.  She is also going to discuss this with her primary care provider  We will obtain a two-view chest x-ray in 6 weeks to ensure resolution of the infiltrates that were seen and her pneumonia.  We will call her with the results   Current Outpatient Medications:  .  albuterol (PROVENTIL HFA;VENTOLIN HFA) 108 (90 Base) MCG/ACT inhaler, Inhale 2 puffs into the lungs every 4 (four) hours as needed for wheezing or shortness of breath (cough, shortness of breath or wheezing.)., Disp: 1 Inhaler, Rfl: 1 .  b complex vitamins tablet, Take 1 tablet by mouth daily., Disp: , Rfl:  .  levothyroxine (SYNTHROID, LEVOTHROID) 88 MCG  tablet, Take 1 tablet (88 mcg total) by mouth daily., Disp: 90 tablet, Rfl: 0 .  ondansetron (ZOFRAN ODT) 4 MG disintegrating tablet, Take 1 tablet (4 mg total) by mouth every 8 (eight) hours as needed for nausea or vomiting., Disp: 20 tablet, Rfl: 0 .  Spacer/Aero-Holding Chambers (AEROCHAMBER MINI CHAMBER) DEVI, 1 Device by Does not apply route every 6 (six) hours., Disp: 1 Device, Rfl: 1 .  Fluticasone-Salmeterol (ADVAIR) 250-50 MCG/DOSE AEPB, Inhale 1 puff into the lungs 2 (two) times daily., Disp: , Rfl:    Garner Nash, DO [facility] 01/18/2018 3:06 PM

## 2018-01-31 DIAGNOSIS — R69 Illness, unspecified: Secondary | ICD-10-CM | POA: Diagnosis not present

## 2018-02-06 ENCOUNTER — Telehealth: Payer: Self-pay | Admitting: Family Medicine

## 2018-02-06 DIAGNOSIS — I712 Thoracic aortic aneurysm, without rupture: Secondary | ICD-10-CM

## 2018-02-06 DIAGNOSIS — I7121 Aneurysm of the ascending aorta, without rupture: Secondary | ICD-10-CM

## 2018-02-06 NOTE — Telephone Encounter (Signed)
Copied from [place] [phone]. Topic: Referral - Request for Referral >> Feb 06, 2018  3:14 PM Virl Axe D wrote: Has patient seen PCP for this complaint? No *If NO, is insurance requiring patient see PCP for this issue before PCP can refer them? Referral for which specialty: Ascending thoracic aortic aneurysm Preferred provider/office: [facility] [place], Cardiothoracic Surgery Department Reason for referral: Information can be found on Pt's recent CT of chest which was performed at [facility] on 01/04/18  Please follow up with pt's daughter Rene Kocher [phone]

## 2018-02-09 NOTE — Telephone Encounter (Signed)
Pt's daughter wanted to check on the status of her mother's referral. Please advise. Rene Kocher [phone]

## 2018-02-11 NOTE — Telephone Encounter (Signed)
Message sent to referrals to follow up  Re: referral

## 2018-02-12 NOTE — Telephone Encounter (Signed)
pls advise

## 2018-02-12 NOTE — Telephone Encounter (Signed)
Per her CT scan at [facility] she has an Ascending thoracic aoritc aneurysm and patients daughter would like her to go see George Charles Hughes  Aka Mali Hughes at [facility]  As soon as possible   Thank you

## 2018-02-13 ENCOUNTER — Telehealth: Payer: Self-pay | Admitting: Family Medicine

## 2018-02-13 NOTE — Telephone Encounter (Signed)
LVM for pt to call the office and reschedule their appt. Due to schedule changes, their provider will not be available. When pt calls back, please reschedule at their convenience.   ° °Thank you!  °

## 2018-02-18 NOTE — Telephone Encounter (Signed)
I have placed the referral for Dr. Ysidro Evert at Cardiothoracic Surgery at [facility]. Please notify the patient.

## 2018-02-21 NOTE — Telephone Encounter (Signed)
Resent referral to Dr. Ysidro Evert office 02/21/2018 at fax # patient gave [place] [phone]

## 2018-02-21 NOTE — Telephone Encounter (Signed)
Daughter Juliann Pulse calling to advise Dr Ysidro Evert office did not receive the pt's referral. She is asking if you would resend to their ofice, and she would like you to fax the referral to her as well, so she can make sure they get it and she wants a copy.  Juliann Pulse states they have been waiting to see this dr and they want to expedite Kathy's fax number: [phone].

## 2018-03-02 ENCOUNTER — Ambulatory Visit (HOSPITAL_COMMUNITY)
Admission: RE | Admit: 2018-03-02 | Discharge: 2018-03-02 | Disposition: A | Discharge status: Home / Self Care | Payer: Managed Care, Other (non HMO) | Source: Ambulatory Visit | Attending: Pulmonary Disease | Admitting: Pulmonary Disease

## 2018-03-02 DIAGNOSIS — J984 Other disorders of lung: Secondary | ICD-10-CM | POA: Diagnosis not present

## 2018-03-02 DIAGNOSIS — Z8701 Personal history of pneumonia (recurrent): Secondary | ICD-10-CM | POA: Insufficient documentation

## 2018-03-10 DIAGNOSIS — R69 Illness, unspecified: Secondary | ICD-10-CM | POA: Diagnosis not present

## 2018-03-26 ENCOUNTER — Other Ambulatory Visit: Payer: Self-pay

## 2018-03-26 ENCOUNTER — Telehealth: Payer: Self-pay | Admitting: Pulmonary Disease

## 2018-03-26 ENCOUNTER — Telehealth: Payer: Self-pay | Admitting: Family Medicine

## 2018-03-26 NOTE — Telephone Encounter (Unsigned)
Copied from [place] [phone]. Topic: Quick Communication - Rx Refill/Question >> Mar 26, 2018 12:42 PM Percell Belt A wrote: Medication: Valacyclovir - Month sores, pt get chronic month sores - pt would like a call back when this is called in.  She stated that she requested a a refill though her pharmacy on 12/15  Has the patient contacted their pharmacy? Yes  (Agent: If no, request that the patient contact the pharmacy for the refill.) (Agent: If yes, when and what did the pharmacy advise?)  Preferred Pharmacy (with phone number or street name): [facility] [place], [place] [phone] (Phone)   Agent: Please be advised that RX refills may take up to 3 business days. We ask that you follow-up with your pharmacy.

## 2018-03-26 NOTE — Telephone Encounter (Signed)
Called and spoke with patient, advised her of xray results. Patient verbalized understanding. [place] you can take this out of your box as I do not have access to. Thank you.

## 2018-03-27 ENCOUNTER — Telehealth: Payer: Self-pay | Admitting: Family Medicine

## 2018-03-27 NOTE — Telephone Encounter (Signed)
Sarah from [facility] called and stated that a referral auth was needed for pt cardiothoracic surgery per pt insurance told Judson Roch I would have to call and confirm and when I did call I spoke to [place] and she stated that a referral auth was not needed. Called sarah back and confirmed.

## 2018-03-29 NOTE — Telephone Encounter (Signed)
I attempted to remove this result from Wanda Hernandez's box. This result note was no longer in her box. Message will be closed.

## 2018-04-05 ENCOUNTER — Ambulatory Visit: Payer: Medicare HMO | Admitting: Family Medicine

## 2018-04-10 DIAGNOSIS — I082 Rheumatic disorders of both aortic and tricuspid valves: Secondary | ICD-10-CM | POA: Diagnosis not present

## 2018-04-10 DIAGNOSIS — I351 Nonrheumatic aortic (valve) insufficiency: Secondary | ICD-10-CM | POA: Diagnosis not present

## 2018-04-10 DIAGNOSIS — I712 Thoracic aortic aneurysm, without rupture: Secondary | ICD-10-CM | POA: Diagnosis not present

## 2018-04-14 ENCOUNTER — Other Ambulatory Visit: Payer: Self-pay

## 2018-04-14 MED ORDER — VALACYCLOVIR HCL 1 G PO TABS: ORAL_TABLET | ORAL | 6 refills | Status: DC

## 2018-05-07 DIAGNOSIS — E038 Other specified hypothyroidism: Secondary | ICD-10-CM | POA: Diagnosis not present

## 2018-05-07 DIAGNOSIS — I714 Abdominal aortic aneurysm, without rupture: Secondary | ICD-10-CM | POA: Diagnosis not present

## 2018-05-07 DIAGNOSIS — Z6821 Body mass index (BMI) 21.0-21.9, adult: Secondary | ICD-10-CM | POA: Diagnosis not present

## 2018-05-07 DIAGNOSIS — Z1331 Encounter for screening for depression: Secondary | ICD-10-CM | POA: Diagnosis not present

## 2018-05-07 DIAGNOSIS — B009 Herpesviral infection, unspecified: Secondary | ICD-10-CM | POA: Diagnosis not present

## 2019-06-21 DIAGNOSIS — H27132 Posterior dislocation of lens, left eye: Secondary | ICD-10-CM | POA: Diagnosis not present

## 2019-06-21 DIAGNOSIS — H43813 Vitreous degeneration, bilateral: Secondary | ICD-10-CM | POA: Diagnosis not present

## 2019-09-02 IMAGING — DX DG CHEST 2V
2 series · 2 of 2 positions shown · non-contrast
Comparison: CT scan dated 01/04/2018 and chest x-rays dated
01/02/2018 and 12/10/2017

CLINICAL DATA: History of pneumonia in December 2017.

EXAM:
CHEST - 2 VIEW

[chest pa]
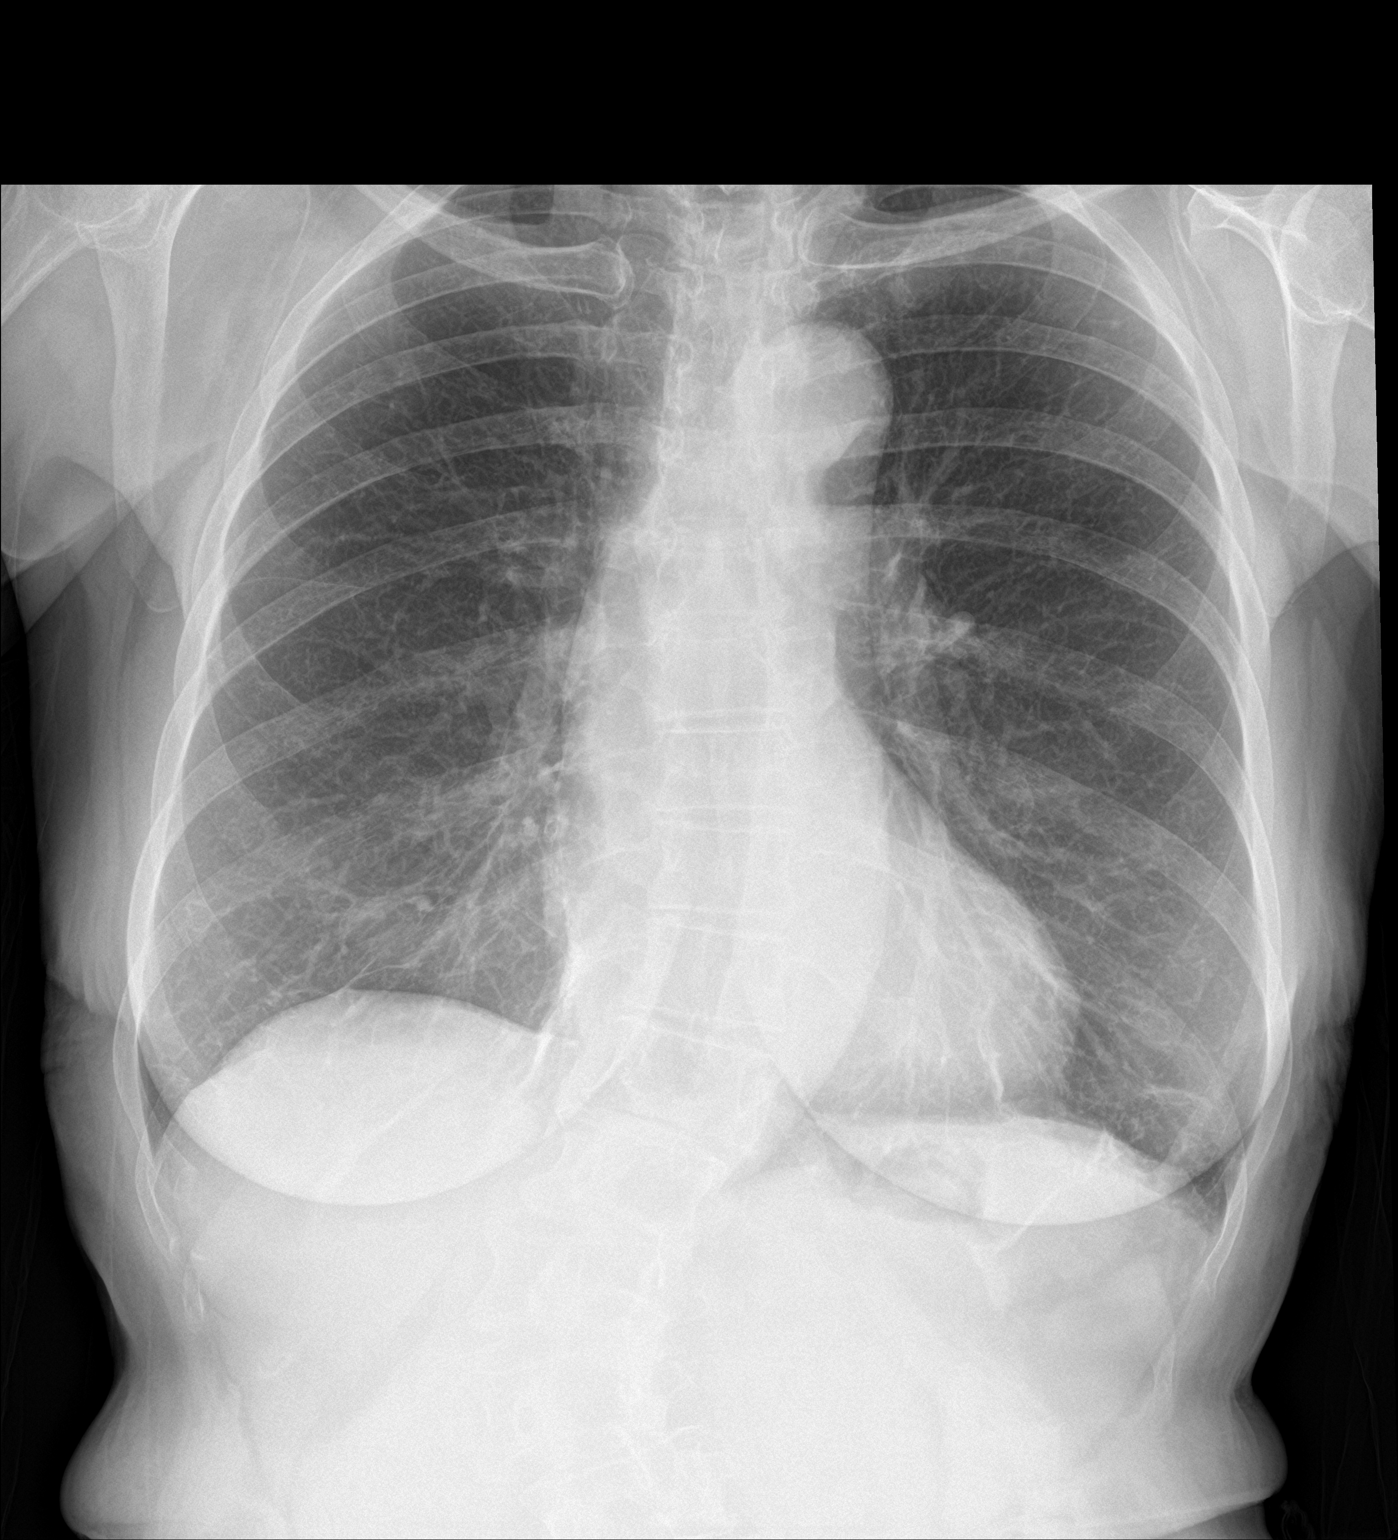

[chest lat]
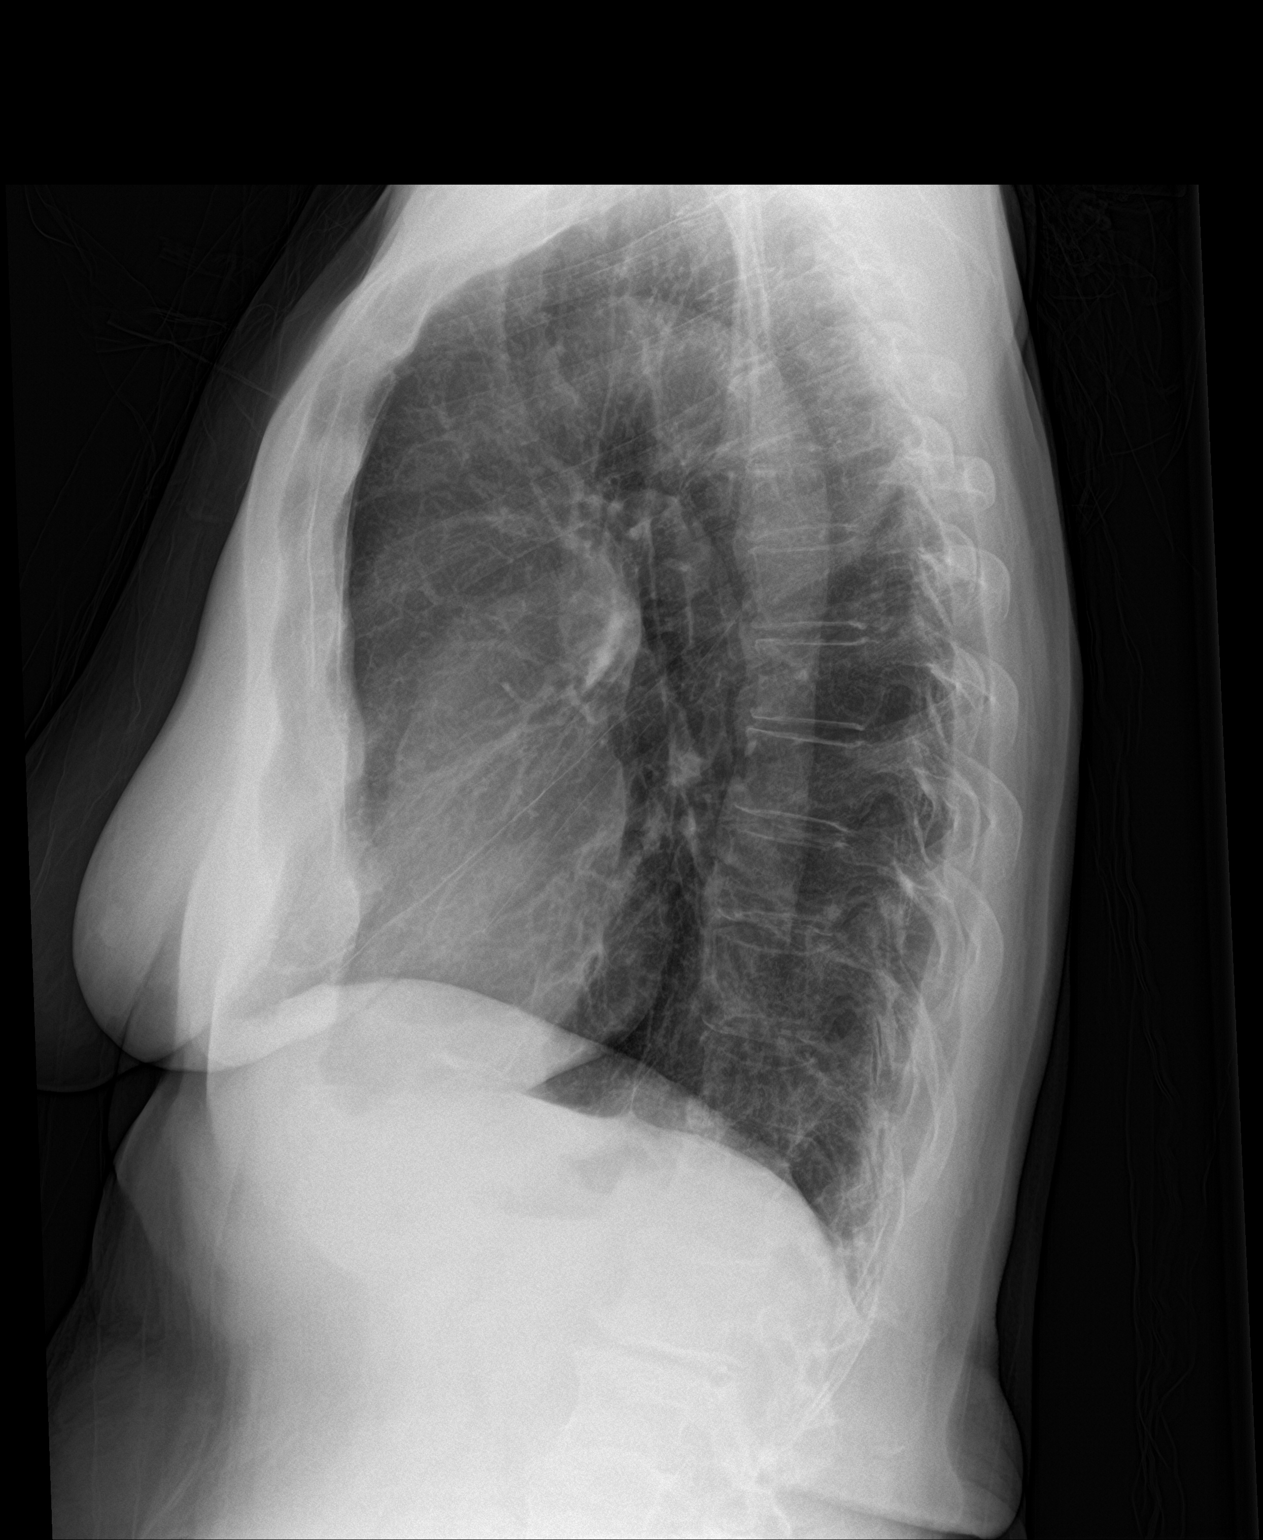

[2 of 2 positions shown; findings below may reference images not displayed]

FINDINGS: The bibasilar infiltrates have resolved. Minimal linear scarring or
atelectasis at the bases. No effusions. Heart size and vascularity
are normal. No acute bone abnormality. Moderately severe lumbar
scoliosis.
IMPRESSION: Clearing of the bibasilar infiltrates.

## 2019-09-04 DIAGNOSIS — R82998 Other abnormal findings in urine: Secondary | ICD-10-CM | POA: Diagnosis not present

## 2019-09-04 DIAGNOSIS — E038 Other specified hypothyroidism: Secondary | ICD-10-CM | POA: Diagnosis not present

## 2019-09-04 DIAGNOSIS — K921 Melena: Secondary | ICD-10-CM | POA: Diagnosis not present

## 2019-09-04 DIAGNOSIS — I10 Essential (primary) hypertension: Secondary | ICD-10-CM | POA: Diagnosis not present

## 2019-09-04 DIAGNOSIS — M859 Disorder of bone density and structure, unspecified: Secondary | ICD-10-CM | POA: Diagnosis not present

## 2019-09-04 DIAGNOSIS — Z0000 Encounter for general adult medical examination without abnormal findings: Secondary | ICD-10-CM | POA: Diagnosis not present

## 2019-09-04 LAB — IFOBT (OCCULT BLOOD): IFOBT: POSITIVE

## 2019-09-12 DIAGNOSIS — I10 Essential (primary) hypertension: Secondary | ICD-10-CM | POA: Diagnosis not present

## 2019-09-12 DIAGNOSIS — Z0000 Encounter for general adult medical examination without abnormal findings: Secondary | ICD-10-CM | POA: Diagnosis not present

## 2019-09-12 DIAGNOSIS — M545 Low back pain: Secondary | ICD-10-CM | POA: Diagnosis not present

## 2019-09-12 DIAGNOSIS — B009 Herpesviral infection, unspecified: Secondary | ICD-10-CM | POA: Diagnosis not present

## 2019-09-12 DIAGNOSIS — M8580 Other specified disorders of bone density and structure, unspecified site: Secondary | ICD-10-CM | POA: Diagnosis not present

## 2019-09-12 DIAGNOSIS — I714 Abdominal aortic aneurysm, without rupture: Secondary | ICD-10-CM | POA: Diagnosis not present

## 2019-09-12 DIAGNOSIS — E7800 Pure hypercholesterolemia, unspecified: Secondary | ICD-10-CM | POA: Diagnosis not present

## 2019-09-12 DIAGNOSIS — R195 Other fecal abnormalities: Secondary | ICD-10-CM | POA: Diagnosis not present

## 2019-09-12 DIAGNOSIS — E039 Hypothyroidism, unspecified: Secondary | ICD-10-CM | POA: Diagnosis not present

## 2019-09-13 ENCOUNTER — Encounter: Payer: Self-pay | Admitting: Physician Assistant

## 2019-09-19 DIAGNOSIS — Z9889 Other specified postprocedural states: Secondary | ICD-10-CM | POA: Diagnosis not present

## 2019-09-19 DIAGNOSIS — H35371 Puckering of macula, right eye: Secondary | ICD-10-CM | POA: Diagnosis not present

## 2019-09-19 DIAGNOSIS — H278 Other specified disorders of lens: Secondary | ICD-10-CM | POA: Diagnosis not present

## 2019-09-19 DIAGNOSIS — Z8669 Personal history of other diseases of the nervous system and sense organs: Secondary | ICD-10-CM | POA: Diagnosis not present

## 2019-10-22 ENCOUNTER — Ambulatory Visit: Payer: Managed Care, Other (non HMO) | Admitting: Physician Assistant

## 2019-11-11 DIAGNOSIS — H20042 Secondary noninfectious iridocyclitis, left eye: Secondary | ICD-10-CM | POA: Diagnosis not present

## 2019-11-11 DIAGNOSIS — H35372 Puckering of macula, left eye: Secondary | ICD-10-CM | POA: Diagnosis not present

## 2019-11-11 DIAGNOSIS — Z961 Presence of intraocular lens: Secondary | ICD-10-CM | POA: Diagnosis not present

## 2019-12-17 DIAGNOSIS — H20042 Secondary noninfectious iridocyclitis, left eye: Secondary | ICD-10-CM | POA: Diagnosis not present

## 2019-12-17 DIAGNOSIS — Z961 Presence of intraocular lens: Secondary | ICD-10-CM | POA: Diagnosis not present

## 2019-12-17 DIAGNOSIS — H35372 Puckering of macula, left eye: Secondary | ICD-10-CM | POA: Diagnosis not present

## 2019-12-27 DIAGNOSIS — I712 Thoracic aortic aneurysm, without rupture: Secondary | ICD-10-CM | POA: Diagnosis not present

## 2019-12-27 DIAGNOSIS — I351 Nonrheumatic aortic (valve) insufficiency: Secondary | ICD-10-CM | POA: Diagnosis not present

## 2019-12-27 DIAGNOSIS — Z87891 Personal history of nicotine dependence: Secondary | ICD-10-CM | POA: Diagnosis not present

## 2020-01-07 ENCOUNTER — Encounter: Payer: Self-pay | Admitting: Gastroenterology

## 2020-01-14 ENCOUNTER — Ambulatory Visit: Payer: Self-pay | Admitting: Physician Assistant

## 2020-01-17 ENCOUNTER — Other Ambulatory Visit: Payer: Self-pay | Admitting: Gastroenterology

## 2020-01-17 ENCOUNTER — Encounter: Payer: Self-pay | Admitting: Gastroenterology

## 2020-01-17 ENCOUNTER — Ambulatory Visit: Payer: Medicare PPO | Admitting: Gastroenterology

## 2020-01-17 VITALS — BP 118/84 | HR 80 | Ht 64.0 in | Wt 128.4 lb

## 2020-01-17 DIAGNOSIS — R195 Other fecal abnormalities: Secondary | ICD-10-CM

## 2020-01-17 DIAGNOSIS — Z1159 Encounter for screening for other viral diseases: Secondary | ICD-10-CM | POA: Diagnosis not present

## 2020-01-17 LAB — SARS CORONAVIRUS 2 (TAT 6-24 HRS): SARS Coronavirus 2: NEGATIVE

## 2020-01-17 MED ORDER — NA SULFATE-K SULFATE-MG SULF 17.5-3.13-1.6 GM/177ML PO SOLN: 1.0000 | Freq: Once | ORAL | 0 refills | Status: AC

## 2020-01-17 MED ORDER — ONDANSETRON HCL 4 MG PO TABS: 4.0000 mg | ORAL_TABLET | ORAL | 0 refills | Status: DC

## 2020-01-17 NOTE — Progress Notes (Addendum)
History of Present Illness: This is an 79 year old female referred by Domenick Gong, MD for the evaluation of occult blood in stool, FIT positive.  She previously underwent colonoscopy in September 2005 for routine screening.  A 4 mm polyp was removed from her sigmoid colon however we do not have the pathology report at this time.  Colonoscopy was otherwise normal to the cecum with a fair bowel prep.  She relates she vomited with the prep and is concerned about vomiting with his colonoscopy prep.  She has mild constipation that is easily managed with dietary measures.  She has no ongoing colorectal complaints.  She reports stool test for occult blood was positive.  We have not received records from her PCP at this time. Denies weight loss, abdominal pain,  diarrhea, change in stool caliber, melena, hematochezia, nausea, vomiting, dysphagia, reflux symptoms, chest pain.     Allergies  Allergen Reactions   Sulfa Antibiotics Other (See Comments)    Childhood reaction pt went into a coma   Codeine    Outpatient Medications Prior to Visit  Medication Sig Dispense Refill   b complex vitamins tablet Take 1 tablet by mouth daily. TRIVITA super B12 3000 mcg     cetirizine (ZYRTEC) 10 MG tablet Take 10 mg by mouth daily.     levothyroxine (SYNTHROID, LEVOTHROID) 88 MCG tablet Take 1 tablet (88 mcg total) by mouth daily. 90 tablet 0   rosuvastatin (CRESTOR) 20 MG tablet Take 20 mg by mouth daily.     telmisartan (MICARDIS) 80 MG tablet Take 80 mg by mouth daily.     valACYclovir (VALTREX) 1000 MG tablet Take 1/2 po qd for prevention and then 2 pills at onset repeat in 2h prn 10 tablet 6   albuterol (PROVENTIL HFA;VENTOLIN HFA) 108 (90 Base) MCG/ACT inhaler Inhale 2 puffs into the lungs every 4 (four) hours as needed for wheezing or shortness of breath (cough, shortness of breath or wheezing.). 1 Inhaler 1   chlorhexidine (PERIDEX) 0.12 % solution      Fluticasone-Salmeterol (ADVAIR)  250-50 MCG/DOSE AEPB Inhale 1 puff into the lungs 2 (two) times daily.     ondansetron (ZOFRAN ODT) 4 MG disintegrating tablet Take 1 tablet (4 mg total) by mouth every 8 (eight) hours as needed for nausea or vomiting. 20 tablet 0   potassium chloride SA (K-DUR,KLOR-CON) 20 MEQ tablet      Spacer/Aero-Holding Chambers (AEROCHAMBER MINI CHAMBER) DEVI 1 Device by Does not apply route every 6 (six) hours. 1 Device 1   No facility-administered medications prior to visit.   Past Medical History:  Diagnosis Date   Colon polyp    History of detached retina repair    HLD (hyperlipidemia)    Hypothyroid    Migraine    Onychomycosis    Osteopenia    Pneumonia    Past Surgical History:  Procedure Laterality Date   RETINAL DETACHMENT SURGERY Left    TUBAL LIGATION     Social History   Socioeconomic History   Marital status: Married    Spouse name: Not on file   Number of children: 5   Years of education: Not on file   Highest education level: Not on file  Occupational History   Occupation: pastor/retired  Tobacco Use   Smoking status: Former Smoker    Types: Cigarettes    Quit date: 1982    Years since quitting: 39.8   Smokeless tobacco: Never Used  Scientific laboratory technician Use: Never  used  Substance and Sexual Activity   Alcohol use: No   Drug use: No   Sexual activity: Not on file  Other Topics Concern   Not on file  Social History Narrative   Marital status: married   Children: 4 daughters and a son   Lives with: husband and son and daughter in law   Employment: yes   Tobacco:  past   Alcohol:  no   Drugs:  no   Exercise:  5 days a week to the gym, 3 days a week lifting weights   Seatbelt:    Guns in home:       Secured:   Social Determinants of Radio broadcast assistant Strain:    Difficulty of Paying Living Expenses: Not on file  Food Insecurity:    Worried About Charity fundraiser in the Last Year: Not on file   [employer] of Food in  the Last Year: Not on file  Transportation Needs:    Lack of Transportation (Medical): Not on file   Lack of Transportation (Non-Medical): Not on file  Physical Activity:    Days of Exercise per Week: Not on file   Minutes of Exercise per Session: Not on file  Stress:    Feeling of Stress : Not on file  Social Connections:    Frequency of Communication with Friends and Family: Not on file   Frequency of Social Gatherings with Friends and Family: Not on file   Attends Religious Services: Not on file   Active Member of Clubs or Organizations: Not on file   Attends Archivist Meetings: Not on file   Marital Status: Not on file   Family History  Problem Relation Age of Onset   Alzheimer's disease Mother 9   Dementia Mother    Heart disease Father 68   Cancer Daughter        megiloblastoma   Diabetes Maternal Grandmother    Breast cancer Maternal Grandmother    Diabetes Maternal Grandfather      Review of Systems: Pertinent positive and negative review of systems were noted in the above HPI section. All other review of systems were otherwise negative.   Physical Exam: General: Well developed, well nourished, no acute distress Head: Normocephalic and atraumatic Eyes:  sclerae anicteric, EOMI Ears: Normal auditory acuity Mouth: Not examined, mask on during Covid-19 pandemic Neck: Supple, no masses or thyromegaly Lungs: Clear throughout to auscultation Heart: Regular rate and rhythm; no murmurs, rubs or bruits Abdomen: Soft, non tender and non distended. No masses, hepatosplenomegaly or hernias noted. Normal Bowel sounds Rectal: Deferred to colonoscopy Musculoskeletal: Symmetrical with no gross deformities  Skin: No lesions on visible extremities Pulses:  Normal pulses noted Extremities: No clubbing, cyanosis, edema or deformities noted Neurological: Alert oriented x 4, grossly nonfocal Cervical Nodes:  No significant cervical  adenopathy Inguinal Nodes: No significant inguinal adenopathy Psychological:  Alert and cooperative. Normal mood and affect   Assessment and Recommendations:  1.  Occult blood in stool, FIT positive.  Personal history of colon polyp, type unknown.  Rule out colorectal neoplasms.  Request records from Dr. Loren Racer office.  Attempt to obtain pathology report from prior polypectomy.  Schedule colonoscopy.  Zofran 4 mg p.o. 30 minutes prior to each prep dose. The risks (including bleeding, perforation, infection, missed lesions, medication reactions and possible hospitalization or surgery if complications occur), benefits, and alternatives to colonoscopy with possible biopsy and possible polypectomy were discussed with the patient and  they consent to proceed.     cc: Bartholomew Crews

## 2020-01-17 NOTE — Patient Instructions (Addendum)
If you are age 80 or older, your body mass index should be between 23-30. Your Body mass index is 22.04 kg/m. If this is out of the aforementioned range listed, please consider follow up with your Primary Care Provider.  If you are age 64 or younger, your body mass index should be between 19-25. Your Body mass index is 22.04 kg/m. If this is out of the aformentioned range listed, please consider follow up with your Primary Care Provider.   You have been scheduled for a colonoscopy. Please follow written instructions given to you at your visit today.  Please pick up your prep supplies at the pharmacy within the next 1-3 days. If you use inhalers (even only as needed), please bring them with you on the day of your procedure.   30 minutes before the start of each prep dose take one zofran tablet    Thank you for choosing me and [facility].  Pricilla Riffle. Dagoberto Ligas., MD., Marval Regal

## 2020-01-20 ENCOUNTER — Encounter: Payer: Self-pay | Admitting: Gastroenterology

## 2020-01-20 ENCOUNTER — Other Ambulatory Visit: Payer: Self-pay

## 2020-01-20 ENCOUNTER — Ambulatory Visit (AMBULATORY_SURGERY_CENTER): Payer: Medicare PPO | Admitting: Gastroenterology

## 2020-01-20 VITALS — BP 109/74 | HR 61 | Temp 97.1°F | Resp 17 | Ht 64.0 in | Wt 128.0 lb

## 2020-01-20 DIAGNOSIS — R195 Other fecal abnormalities: Secondary | ICD-10-CM

## 2020-01-20 DIAGNOSIS — D125 Benign neoplasm of sigmoid colon: Secondary | ICD-10-CM | POA: Diagnosis not present

## 2020-01-20 DIAGNOSIS — D124 Benign neoplasm of descending colon: Secondary | ICD-10-CM

## 2020-01-20 DIAGNOSIS — D123 Benign neoplasm of transverse colon: Secondary | ICD-10-CM

## 2020-01-20 DIAGNOSIS — D120 Benign neoplasm of cecum: Secondary | ICD-10-CM | POA: Diagnosis not present

## 2020-01-20 DIAGNOSIS — Z86010 Personal history of colonic polyps: Secondary | ICD-10-CM

## 2020-01-20 DIAGNOSIS — D122 Benign neoplasm of ascending colon: Secondary | ICD-10-CM | POA: Diagnosis not present

## 2020-01-20 MED ORDER — SODIUM CHLORIDE 0.9 % IV SOLN: 500.0000 mL | Freq: Once | INTRAVENOUS | Status: DC

## 2020-01-20 NOTE — Progress Notes (Signed)
VS by CW  Pt's states no medical or surgical changes since previsit or office visit.  

## 2020-01-20 NOTE — Progress Notes (Signed)
Report to PACU, RN, vss, BBS= Clear.  

## 2020-01-20 NOTE — Patient Instructions (Signed)
Handouts given for polyps, diverticulosis and High Fiber diet.  No NSAIDS (Aspirin, Ibuprofen, Aleve, Naproxen) for 2 weeks, you may take tylenol.  Await Pathology results.  YOU HAD AN ENDOSCOPIC PROCEDURE TODAY AT [facility]:   Refer to the procedure report that was given to you for any specific questions about what was found during the examination.  If the procedure report does not answer your questions, please call your gastroenterologist to clarify.  If you requested that your care partner not be given the details of your procedure findings, then the procedure report has been included in a sealed envelope for you to review at your convenience later.  YOU SHOULD EXPECT: Some feelings of bloating in the abdomen. Passage of more gas than usual.  Walking can help get rid of the air that was put into your GI tract during the procedure and reduce the bloating. If you had a lower endoscopy (such as a colonoscopy or flexible sigmoidoscopy) you may notice spotting of blood in your stool or on the toilet paper. If you underwent a bowel prep for your procedure, you may not have a normal bowel movement for a few days.  Please Note:  You might notice some irritation and congestion in your nose or some drainage.  This is from the oxygen used during your procedure.  There is no need for concern and it should clear up in a day or so.  SYMPTOMS TO REPORT IMMEDIATELY:   Following lower endoscopy (colonoscopy or flexible sigmoidoscopy):  Excessive amounts of blood in the stool  Significant tenderness or worsening of abdominal pains  Swelling of the abdomen that is new, acute  Fever of 100F or higher  For urgent or emergent issues, a gastroenterologist can be reached at any hour by calling [phone]. Do not use MyChart messaging for urgent concerns.    DIET:  We do recommend a small meal at first, but then you may proceed to your regular diet.  Drink plenty of fluids but you should  avoid alcoholic beverages for 24 hours.  ACTIVITY:  You should plan to take it easy for the rest of today and you should NOT DRIVE or use heavy machinery until tomorrow (because of the sedation medicines used during the test).    FOLLOW UP: Our staff will call the number listed on your records 48-72 hours following your procedure to check on you and address any questions or concerns that you may have regarding the information given to you following your procedure. If we do not reach you, we will leave a message.  We will attempt to reach you two times.  During this call, we will ask if you have developed any symptoms of COVID 19. If you develop any symptoms (ie: fever, flu-like symptoms, shortness of breath, cough etc.) before then, please call [phone].  If you test positive for Covid 19 in the 2 weeks post procedure, please call and report this information to [place].    If any biopsies were taken you will be contacted by phone or by letter within the next 1-3 weeks.  Please call us at [phone] if you have not heard about the biopsies in 3 weeks.    SIGNATURES/CONFIDENTIALITY: You and/or your care partner have signed paperwork which will be entered into your electronic medical record.  These signatures attest to the fact that that the information above on your After Visit Summary has been reviewed and is understood.  Full responsibility of the confidentiality of  this discharge information lies with you and/or your care-partner.  ?

## 2020-01-20 NOTE — Op Note (Addendum)
[facility] Patient Name: Wanda Hernandez Procedure Date: 01/20/2020 8:05 AM MRN: [id number] Endoscopist: Ladene Artist , MD Age: 79 Referring MD:  Date of Birth: November 25, 1940 Gender: Female Account #: [id number] Procedure:                Colonoscopy Indications:              Positive fecal immunochemical test, Personal                            history of colon polyps, type unknown. Medicines:                Monitored Anesthesia Care Procedure:                Pre-Anesthesia Assessment:                           - Prior to the procedure, a History and Physical                            was performed, and patient medications and                            allergies were reviewed. The patient's tolerance of                            previous anesthesia was also reviewed. The risks                            and benefits of the procedure and the sedation                            options and risks were discussed with the patient.                            All questions were answered, and informed consent                            was obtained. Prior Anticoagulants: The patient has                            taken no previous anticoagulant or antiplatelet                            agents. ASA Grade Assessment: III - A patient with                            severe systemic disease. After reviewing the risks                            and benefits, the patient was deemed in                            satisfactory condition to undergo the procedure.  After obtaining informed consent, the colonoscope                            was passed under direct vision. Throughout the                            procedure, the patient's blood pressure, pulse, and                            oxygen saturations were monitored continuously. The                            Colonoscope was introduced through the anus and                            advanced to the the  cecum, identified by                            appendiceal orifice and ileocecal valve. The                            ileocecal valve, appendiceal orifice, and rectum                            were photographed. The quality of the bowel                            preparation was good. The colonoscopy was performed                            without difficulty. The patient tolerated the                            procedure well. Scope In: 8:07:50 AM Scope Out: 8:34:12 AM Scope Withdrawal Time: 0 hours 23 minutes 18 seconds  Total Procedure Duration: 0 hours 26 minutes 22 seconds  Findings:                 The perianal and digital rectal examinations were                            normal.                           A 3 mm polyp was found in the cecum. The polyp was                            sessile. The polyp was removed with a cold biopsy                            forceps. Resection and retrieval were complete.                           Sixteen sessile polyps were found in the sigmoid  colon (8), descending colon (2), transverse colon                            (4) and ascending colon (2). The polyps were 5 to 9                            mm in size. These polyps were removed with a cold                            snare. Resection and retrieval were complete.                           Multiple medium-mouthed diverticula were found in                            the left colon. There was narrowing of the colon in                            association with the diverticular opening.                           The exam was otherwise without abnormality on                            direct and retroflexion views. Complications:            No immediate complications. Estimated blood loss:                            None. Estimated Blood Loss:     Estimated blood loss: none. Impression:               - One 3 mm polyp in the cecum, removed with a cold                             biopsy forceps. Resected and retrieved.                           - Sixteen 5 to 9 mm polyps in the sigmoid colon, in                            the descending colon, in the transverse colon and                            in the ascending colon, removed with a cold snare.                            Resected and retrieved.                           - Moderate diverticulosis in the left colon.                           - The examination was otherwise  normal on direct                            and retroflexion views. Recommendation:           - Repeat colonoscopy date to be determined after                            pending pathology results are reviewed for                            surveillance.                           - Patient has a contact number available for                            emergencies. The signs and symptoms of potential                            delayed complications were discussed with the                            patient. Return to normal activities tomorrow.                            Written discharge instructions were provided to the                            patient.                           - High fiber diet.                           - Continue present medications.                           - Await pathology results.                           - No aspirin, ibuprofen, naproxen, or other                            non-steroidal anti-inflammatory drugs for 2 weeks                            after polyp removal. Ladene Artist, MD 01/20/2020 8:41:33 AM This report has been signed electronically.

## 2020-01-22 ENCOUNTER — Telehealth: Payer: Self-pay | Admitting: *Deleted

## 2020-01-22 NOTE — Telephone Encounter (Signed)
°  Follow up Call-  Call back number 01/20/2020  Post procedure Call Back phone  # [phone]  Permission to leave phone message Yes  Some recent data might be hidden     Patient questions:  Do you have a fever, pain , or abdominal swelling? No. Pain Score  0 *  Have you tolerated food without any problems? Yes.    Have you been able to return to your normal activities? Yes.    Do you have any questions about your discharge instructions: Diet   No. Medications  No. Follow up visit  No.  Do you have questions or concerns about your Care? No.  Actions: * If pain score is 4 or above: No action needed, pain <4.  1. Have you developed a fever since your procedure? no  2.   Have you had an respiratory symptoms (SOB or cough) since your procedure? no  3.   Have you tested positive for COVID 19 since your procedure no  4.   Have you had any family members/close contacts diagnosed with the COVID 19 since your procedure?  no   If yes to any of these questions please route to Joylene John, RN and Joella Prince, RN

## 2020-01-28 ENCOUNTER — Encounter: Payer: Self-pay | Admitting: Gastroenterology

## 2020-03-18 DIAGNOSIS — I700 Atherosclerosis of aorta: Secondary | ICD-10-CM | POA: Diagnosis not present

## 2020-03-18 DIAGNOSIS — R918 Other nonspecific abnormal finding of lung field: Secondary | ICD-10-CM | POA: Diagnosis not present

## 2020-03-18 DIAGNOSIS — Z72820 Sleep deprivation: Secondary | ICD-10-CM | POA: Diagnosis not present

## 2020-03-18 DIAGNOSIS — J1282 Pneumonia due to coronavirus disease 2019: Secondary | ICD-10-CM | POA: Diagnosis not present

## 2020-03-18 DIAGNOSIS — J90 Pleural effusion, not elsewhere classified: Secondary | ICD-10-CM | POA: Diagnosis not present

## 2020-03-18 DIAGNOSIS — R21 Rash and other nonspecific skin eruption: Secondary | ICD-10-CM | POA: Diagnosis not present

## 2020-03-18 DIAGNOSIS — J9601 Acute respiratory failure with hypoxia: Secondary | ICD-10-CM | POA: Diagnosis not present

## 2020-03-18 DIAGNOSIS — R0602 Shortness of breath: Secondary | ICD-10-CM | POA: Diagnosis not present

## 2020-03-18 DIAGNOSIS — D696 Thrombocytopenia, unspecified: Secondary | ICD-10-CM | POA: Diagnosis not present

## 2020-03-18 DIAGNOSIS — U071 COVID-19: Secondary | ICD-10-CM | POA: Diagnosis not present

## 2020-03-18 DIAGNOSIS — I712 Thoracic aortic aneurysm, without rupture: Secondary | ICD-10-CM | POA: Diagnosis not present

## 2020-03-18 DIAGNOSIS — F419 Anxiety disorder, unspecified: Secondary | ICD-10-CM | POA: Diagnosis not present

## 2020-03-18 DIAGNOSIS — Z87891 Personal history of nicotine dependence: Secondary | ICD-10-CM | POA: Diagnosis not present

## 2020-03-18 DIAGNOSIS — R0902 Hypoxemia: Secondary | ICD-10-CM | POA: Diagnosis not present

## 2020-03-18 DIAGNOSIS — J439 Emphysema, unspecified: Secondary | ICD-10-CM | POA: Diagnosis not present

## 2020-03-18 DIAGNOSIS — G479 Sleep disorder, unspecified: Secondary | ICD-10-CM | POA: Diagnosis not present

## 2020-03-25 DIAGNOSIS — U071 COVID-19: Secondary | ICD-10-CM | POA: Diagnosis not present

## 2020-03-28 DIAGNOSIS — Z7901 Long term (current) use of anticoagulants: Secondary | ICD-10-CM | POA: Diagnosis not present

## 2020-03-28 DIAGNOSIS — Z9981 Dependence on supplemental oxygen: Secondary | ICD-10-CM | POA: Diagnosis not present

## 2020-03-28 DIAGNOSIS — I714 Abdominal aortic aneurysm, without rupture: Secondary | ICD-10-CM | POA: Diagnosis not present

## 2020-03-28 DIAGNOSIS — J1282 Pneumonia due to coronavirus disease 2019: Secondary | ICD-10-CM | POA: Diagnosis not present

## 2020-03-28 DIAGNOSIS — U071 COVID-19: Secondary | ICD-10-CM | POA: Diagnosis not present

## 2020-04-01 DIAGNOSIS — J1282 Pneumonia due to coronavirus disease 2019: Secondary | ICD-10-CM | POA: Diagnosis not present

## 2020-04-01 DIAGNOSIS — U071 COVID-19: Secondary | ICD-10-CM | POA: Diagnosis not present

## 2020-04-01 DIAGNOSIS — R0902 Hypoxemia: Secondary | ICD-10-CM | POA: Diagnosis not present

## 2020-04-01 DIAGNOSIS — I712 Thoracic aortic aneurysm, without rupture: Secondary | ICD-10-CM | POA: Diagnosis not present

## 2020-04-02 DIAGNOSIS — I714 Abdominal aortic aneurysm, without rupture: Secondary | ICD-10-CM | POA: Diagnosis not present

## 2020-04-02 DIAGNOSIS — U071 COVID-19: Secondary | ICD-10-CM | POA: Diagnosis not present

## 2020-04-02 DIAGNOSIS — Z7901 Long term (current) use of anticoagulants: Secondary | ICD-10-CM | POA: Diagnosis not present

## 2020-04-02 DIAGNOSIS — J1282 Pneumonia due to coronavirus disease 2019: Secondary | ICD-10-CM | POA: Diagnosis not present

## 2020-04-02 DIAGNOSIS — Z9981 Dependence on supplemental oxygen: Secondary | ICD-10-CM | POA: Diagnosis not present

## 2020-04-03 DIAGNOSIS — J1282 Pneumonia due to coronavirus disease 2019: Secondary | ICD-10-CM | POA: Diagnosis not present

## 2020-04-03 DIAGNOSIS — U071 COVID-19: Secondary | ICD-10-CM | POA: Diagnosis not present

## 2020-04-03 DIAGNOSIS — I714 Abdominal aortic aneurysm, without rupture: Secondary | ICD-10-CM | POA: Diagnosis not present

## 2020-04-03 DIAGNOSIS — Z9981 Dependence on supplemental oxygen: Secondary | ICD-10-CM | POA: Diagnosis not present

## 2020-04-03 DIAGNOSIS — Z7901 Long term (current) use of anticoagulants: Secondary | ICD-10-CM | POA: Diagnosis not present

## 2020-04-06 DIAGNOSIS — J1282 Pneumonia due to coronavirus disease 2019: Secondary | ICD-10-CM | POA: Diagnosis not present

## 2020-04-06 DIAGNOSIS — Z9981 Dependence on supplemental oxygen: Secondary | ICD-10-CM | POA: Diagnosis not present

## 2020-04-06 DIAGNOSIS — Z7901 Long term (current) use of anticoagulants: Secondary | ICD-10-CM | POA: Diagnosis not present

## 2020-04-06 DIAGNOSIS — U071 COVID-19: Secondary | ICD-10-CM | POA: Diagnosis not present

## 2020-04-06 DIAGNOSIS — I714 Abdominal aortic aneurysm, without rupture: Secondary | ICD-10-CM | POA: Diagnosis not present

## 2020-04-08 DIAGNOSIS — I714 Abdominal aortic aneurysm, without rupture: Secondary | ICD-10-CM | POA: Diagnosis not present

## 2020-04-08 DIAGNOSIS — Z9981 Dependence on supplemental oxygen: Secondary | ICD-10-CM | POA: Diagnosis not present

## 2020-04-08 DIAGNOSIS — J1282 Pneumonia due to coronavirus disease 2019: Secondary | ICD-10-CM | POA: Diagnosis not present

## 2020-04-08 DIAGNOSIS — U071 COVID-19: Secondary | ICD-10-CM | POA: Diagnosis not present

## 2020-04-08 DIAGNOSIS — Z7901 Long term (current) use of anticoagulants: Secondary | ICD-10-CM | POA: Diagnosis not present

## 2020-04-10 DIAGNOSIS — Z7901 Long term (current) use of anticoagulants: Secondary | ICD-10-CM | POA: Diagnosis not present

## 2020-04-10 DIAGNOSIS — I714 Abdominal aortic aneurysm, without rupture: Secondary | ICD-10-CM | POA: Diagnosis not present

## 2020-04-10 DIAGNOSIS — Z9981 Dependence on supplemental oxygen: Secondary | ICD-10-CM | POA: Diagnosis not present

## 2020-04-10 DIAGNOSIS — U071 COVID-19: Secondary | ICD-10-CM | POA: Diagnosis not present

## 2020-04-10 DIAGNOSIS — J1282 Pneumonia due to coronavirus disease 2019: Secondary | ICD-10-CM | POA: Diagnosis not present

## 2020-04-15 ENCOUNTER — Encounter: Payer: Self-pay | Admitting: Pulmonary Disease

## 2020-04-15 ENCOUNTER — Other Ambulatory Visit: Payer: Self-pay

## 2020-04-15 ENCOUNTER — Ambulatory Visit (INDEPENDENT_AMBULATORY_CARE_PROVIDER_SITE_OTHER): Payer: Medicare PPO | Admitting: Pulmonary Disease

## 2020-04-15 VITALS — BP 110/78 | HR 94 | Temp 97.8°F | Ht 64.0 in | Wt 123.1 lb

## 2020-04-15 DIAGNOSIS — J1282 Pneumonia due to coronavirus disease 2019: Secondary | ICD-10-CM | POA: Diagnosis not present

## 2020-04-15 DIAGNOSIS — Z7185 Encounter for immunization safety counseling: Secondary | ICD-10-CM | POA: Diagnosis not present

## 2020-04-15 DIAGNOSIS — U071 COVID-19: Secondary | ICD-10-CM

## 2020-04-15 NOTE — Patient Instructions (Addendum)
Thank you for visiting Dr. Robi Dewolfe at [facility]. Today we recommend the following:  Return if symptoms worsen or fail to improve.    Please do your part to reduce the spread of COVID-19.  

## 2020-04-15 NOTE — Progress Notes (Signed)
Synopsis: Referred in Oct 2019 for hx of pneumonia by Tisovec, Fransico Him, MD  Subjective:   PATIENT ID: Wanda Hernandez GENDER: female DOB: 1940-09-26, MRN: [id number]  Chief Complaint  Patient presents with  . Follow-up    Pt stated that she is doing well.  Finished the blood thinners yesterday.  She stated that she continues to over do her activity during the day.      PMH colon polyps, migraine. She sees a PCP at [facility]. She lives in [place]. She was diagnosed with pneumonia. She was treated with three different rounds. She finally completed a course of levaquin.  She is a former smoker, quit 38 years ago, only smoked for approximately 10 years, less than half pack per day.  She currently lives in [place] she is a Theme park manager.  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  She has been doing well since the past week or so.  She has actually went back to the station and able to record her first session in the past 6 weeks.  She has a daughter who is a PA and son-in-law who is a Hydrographic surveyor in [place]. She was seen in the emergency room after having a CAT scan done after being followed up by her PCP with concern of evaluation for a potential pleural effusion or complication related to her recent pneumonia.  The CT scan revealed a consolidation within the right lower lobe with no evidence of pleural effusion.  She completed a final course of Levaquin and has otherwise been doing well.  OV 04/15/2020: recently admitted to the hospital in December 2021 for COVID-19, unvaccinated,, 15th she started symptoms. She was admitted to [facility] for 7 days, she was given steroids, dvt ppx with eliquis (which she has finished).  At this point she has returned back home.  She is off oxygen.  She does have dyspnea on exertion.  She feels short of breath going to the grocery store or carrying heavy items.   Past Medical  History:  Diagnosis Date  . Colon polyp   . History of detached retina repair   . HLD (hyperlipidemia)   . Hypothyroid   . Migraine   . Onychomycosis   . Osteopenia   . Pneumonia      Family History  Problem Relation Age of Onset  . Alzheimer's disease Mother 65  . Dementia Mother   . Heart disease Father 33  . Cancer Daughter        megiloblastoma  . Diabetes Maternal Grandmother   . Breast cancer Maternal Grandmother   . Diabetes Maternal Grandfather   . Colon cancer Neg Hx   . Esophageal cancer Neg Hx   . Rectal cancer Neg Hx   . Stomach cancer Neg Hx      Past Surgical History:  Procedure Laterality Date  . RETINAL DETACHMENT SURGERY Left   . TUBAL LIGATION      Social History   Socioeconomic History  . Marital status: Married    Spouse name: Not on file  . Number of children: 5  . Years of education: Not on file  . Highest education level: Not on file  Occupational History  . Occupation: pastor/retired  Tobacco Use  . Smoking status: Former Smoker    Types: Cigarettes    Quit date: 1982    Years since quitting: 40.0  . Smokeless tobacco: Never Used  Vaping Use  .  Vaping Use: Never used  Substance and Sexual Activity  . Alcohol use: No  . Drug use: No  . Sexual activity: Not on file  Other Topics Concern  . Not on file  Social History Narrative   Marital status: married   Children: 4 daughters and a son   Lives with: husband and son and daughter in law   Employment: yes   Tobacco:  past   Alcohol:  no   Drugs:  no   Exercise:  5 days a week to the gym, 3 days a week lifting weights   Seatbelt:    Guns in home:       Secured:   Social Determinants of Radio broadcast assistant Strain: Not on file  Food Insecurity: Not on file  Transportation Needs: Not on file  Physical Activity: Not on file  Stress: Not on file  Social Connections: Not on file  Intimate Partner Violence: Not on file     Allergies  Allergen Reactions  . Sulfa  Antibiotics Other (See Comments)    Childhood reaction pt went into a coma  . Contrast Media [Iodinated Diagnostic Agents] Rash    Other reaction(s): Abdominal Pain  . Codeine      Outpatient Medications Prior to Visit  Medication Sig Dispense Refill  . albuterol (VENTOLIN HFA) 108 (90 Base) MCG/ACT inhaler Inhale 2 puffs into the lungs every 6 (six) hours as needed.    . Ascorbic Acid (VITAMIN C) 1000 MG tablet Take 1,000 mg by mouth daily.    . benzonatate (TESSALON) 100 MG capsule Take 1 capsule by mouth 3 (three) times daily as needed.    . cetirizine (ZYRTEC) 10 MG tablet Take 10 mg by mouth daily.    . Cholecalciferol (VITAMIN D) 50 MCG (2000 UT) CAPS Take 1 tablet by mouth daily.    . Cyanocobalamin (VITAMIN B 12 PO) Take 1,000 mcg by mouth daily.    . famotidine (PEPCID) 20 MG tablet Take 1 tablet by mouth at bedtime as needed.    Marland Kitchen levothyroxine (SYNTHROID, LEVOTHROID) 88 MCG tablet Take 1 tablet (88 mcg total) by mouth daily. 90 tablet 0  . rosuvastatin (CRESTOR) 20 MG tablet Take 1 tablet by mouth 2 (two) times a week.    . telmisartan (MICARDIS) 80 MG tablet Take 80 mg by mouth daily.    Marland Kitchen zinc gluconate 50 MG tablet Take 50 mg by mouth daily.    Marland Kitchen apixaban (ELIQUIS) 2.5 MG TABS tablet Take 2.5 mg by mouth in the morning and at bedtime. (Patient not taking: Reported on 04/15/2020)    . b complex vitamins tablet Take 1 tablet by mouth daily. TRIVITA super B12 3000 mcg    . fluticasone (FLONASE) 50 MCG/ACT nasal spray Place 1 spray into the nose daily.    Marland Kitchen ibuprofen (ADVIL) 200 MG tablet Take by mouth.    . ondansetron (ZOFRAN) 4 MG tablet Take 1 tablet (4 mg total) by mouth as directed. (Patient not taking: Reported on 01/20/2020) 2 tablet 0  . OVER THE COUNTER MEDICATION 1 capsule daily.    . OXYGEN Inhale 5 L into the lungs daily. (Patient not taking: Reported on 04/15/2020)    . valACYclovir (VALTREX) 1000 MG tablet Take 1/2 po qd for prevention and then 2 pills at onset  repeat in 2h prn 10 tablet 6   No facility-administered medications prior to visit.    Review of Systems  Constitutional: Negative for chills, fever, malaise/fatigue and weight  loss.  HENT: Negative for hearing loss, sore throat and tinnitus.   Eyes: Negative for blurred vision and double vision.  Respiratory: Positive for cough and shortness of breath. Negative for hemoptysis, sputum production, wheezing and stridor.   Cardiovascular: Negative for chest pain, palpitations, orthopnea, leg swelling and PND.  Gastrointestinal: Negative for abdominal pain, constipation, diarrhea, heartburn, nausea and vomiting.  Genitourinary: Negative for dysuria, hematuria and urgency.  Musculoskeletal: Negative for joint pain and myalgias.  Skin: Negative for itching and rash.  Neurological: Negative for dizziness, tingling, weakness and headaches.  Endo/Heme/Allergies: Negative for environmental allergies. Does not bruise/bleed easily.  Psychiatric/Behavioral: Negative for depression. The patient is not nervous/anxious and does not have insomnia.   All other systems reviewed and are negative.    Objective:  Physical Exam Vitals reviewed.  Constitutional:      General: She is not in acute distress.    Appearance: She is well-developed and well-nourished.     Comments: Elderly, frail  HENT:     Head: Normocephalic and atraumatic.     Mouth/Throat:     Mouth: Oropharynx is clear and moist.  Eyes:     General: No scleral icterus.    Conjunctiva/sclera: Conjunctivae normal.     Pupils: Pupils are equal, round, and reactive to light.  Neck:     Vascular: No JVD.     Trachea: No tracheal deviation.  Cardiovascular:     Rate and Rhythm: Normal rate and regular rhythm.     Pulses: Intact distal pulses.     Heart sounds: Normal heart sounds. No murmur heard.   Pulmonary:     Effort: Pulmonary effort is normal. No tachypnea, accessory muscle usage or respiratory distress.     Breath sounds: No  stridor. No wheezing, rhonchi or rales.  Abdominal:     General: Bowel sounds are normal. There is no distension.     Palpations: Abdomen is soft.     Tenderness: There is no abdominal tenderness.  Musculoskeletal:        General: No tenderness or edema.     Cervical back: Neck supple.  Lymphadenopathy:     Cervical: No cervical adenopathy.  Skin:    General: Skin is warm and dry.     Capillary Refill: Capillary refill takes less than 2 seconds.     Findings: No rash.  Neurological:     Mental Status: She is alert and oriented to person, place, and time.  Psychiatric:        Mood and Affect: Mood and affect normal.        Behavior: Behavior normal.     Vitals:   04/15/20 1401  BP: 110/78  Pulse: 94  Temp: 97.8 F (36.6 C)  TempSrc: Tympanic  SpO2: 97%  Weight: 123 lb 2 oz (55.8 kg)  Height: 5\' 4"  (1.626 m)   97% on RA BMI Readings from Last 3 Encounters:  04/15/20 21.13 kg/m  01/20/20 21.97 kg/m  01/17/20 22.04 kg/m   Wt Readings from Last 3 Encounters:  04/15/20 123 lb 2 oz (55.8 kg)  01/20/20 128 lb (58.1 kg)  01/17/20 128 lb 6 oz (58.2 kg)     CBC    Component Value Date/Time   WBC 7.3 01/04/2018 1435   RBC 4.29 01/04/2018 1435   HGB 13.8 01/04/2018 1435   HGB 15.4 06/17/2017 1621   HCT 41.1 01/04/2018 1435   HCT 45.5 06/17/2017 1621   PLT 244 01/04/2018 1435   PLT 207 06/17/2017 1621  MCV 95.8 01/04/2018 1435   MCV 95.5 01/02/2018 1212   MCV 95 06/17/2017 1621   MCH 32.2 01/04/2018 1435   MCHC 33.6 01/04/2018 1435   RDW 13.1 01/04/2018 1435   RDW 14.4 06/17/2017 1621   LYMPHSABS 0.8 01/04/2018 1435   MONOABS 0.5 01/04/2018 1435   EOSABS 0.3 01/04/2018 1435   BASOSABS 0.0 01/04/2018 1435    Chest Imaging: CXR and CT imaging: CT imaging with right lower lobe consolidation.  I does appear to be improving from prior chest x-ray imaging. Images reviewed with the patient in the chart today. The patient's images have been independently  reviewed by me.    Pulmonary Functions Testing Results: None   FeNO: None   Pathology: None   Echocardiogram: None   Heart Catheterization: None     Assessment & Plan:   Pneumonia due to COVID-19 virus  Vaccine counseling  Discussion:  80 year old female prior history of right lower lobe pneumonia initially seen in the office for follow-up regarding this back in 2019.  Here today for follow-up after recent diagnosis of COVID-19.  She has been recovering slowly from this.  Her recovery seems to be in the appropriate trajectory.  We talked today about how it is likely going to take several weeks to months before she is back to her baseline.  We also spent an extensive amount of time regarding COVID-19 vaccine counseling.  Patient has still refused to obtain consideration for vaccination.    Recommend ongoing supportive care and continued return to her daily activities.  If she has persistent symptoms beyond 6 months can consider pulmonary function tests or high-resolution CT imaging of the chest for evaluation of scarring or fibrosis related to COVID-pneumonia.  I do not believe is necessary at this time.  Patient to call and let us know if her symptoms are not resolving.   Current Outpatient Medications:  .  albuterol (VENTOLIN HFA) 108 (90 Base) MCG/ACT inhaler, Inhale 2 puffs into the lungs every 6 (six) hours as needed., Disp: , Rfl:  .  Ascorbic Acid (VITAMIN C) 1000 MG tablet, Take 1,000 mg by mouth daily., Disp: , Rfl:  .  benzonatate (TESSALON) 100 MG capsule, Take 1 capsule by mouth 3 (three) times daily as needed., Disp: , Rfl:  .  cetirizine (ZYRTEC) 10 MG tablet, Take 10 mg by mouth daily., Disp: , Rfl:  .  Cholecalciferol (VITAMIN D) 50 MCG (2000 UT) CAPS, Take 1 tablet by mouth daily., Disp: , Rfl:  .  Cyanocobalamin (VITAMIN B 12 PO), Take 1,000 mcg by mouth daily., Disp: , Rfl:  .  famotidine (PEPCID) 20 MG tablet, Take 1 tablet by mouth at bedtime as needed., Disp:  , Rfl:  .  levothyroxine (SYNTHROID, LEVOTHROID) 88 MCG tablet, Take 1 tablet (88 mcg total) by mouth daily., Disp: 90 tablet, Rfl: 0 .  rosuvastatin (CRESTOR) 20 MG tablet, Take 1 tablet by mouth 2 (two) times a week., Disp: , Rfl:  .  telmisartan (MICARDIS) 80 MG tablet, Take 80 mg by mouth daily., Disp: , Rfl:  .  zinc gluconate 50 MG tablet, Take 50 mg by mouth daily., Disp: , Rfl:    I spent 42 minutes dedicated to the care of this patient on the date of this encounter to include pre-visit review of records, face-to-face time with the patient discussing conditions above, post visit ordering of testing, clinical documentation with the electronic health record, making appropriate referrals as documented, and communicating necessary findings to members  of the patients care team.    Garner Nash, DO [facility] 04/15/2020 2:12 PM

## 2020-04-16 DIAGNOSIS — Z7901 Long term (current) use of anticoagulants: Secondary | ICD-10-CM | POA: Diagnosis not present

## 2020-04-16 DIAGNOSIS — Z9981 Dependence on supplemental oxygen: Secondary | ICD-10-CM | POA: Diagnosis not present

## 2020-04-16 DIAGNOSIS — I714 Abdominal aortic aneurysm, without rupture: Secondary | ICD-10-CM | POA: Diagnosis not present

## 2020-04-16 DIAGNOSIS — J1282 Pneumonia due to coronavirus disease 2019: Secondary | ICD-10-CM | POA: Diagnosis not present

## 2020-04-16 DIAGNOSIS — U071 COVID-19: Secondary | ICD-10-CM | POA: Diagnosis not present

## 2020-04-21 DIAGNOSIS — H35372 Puckering of macula, left eye: Secondary | ICD-10-CM | POA: Diagnosis not present

## 2020-04-21 DIAGNOSIS — Z961 Presence of intraocular lens: Secondary | ICD-10-CM | POA: Diagnosis not present

## 2020-04-21 DIAGNOSIS — H20042 Secondary noninfectious iridocyclitis, left eye: Secondary | ICD-10-CM | POA: Diagnosis not present

## 2021-04-27 ENCOUNTER — Ambulatory Visit (INDEPENDENT_AMBULATORY_CARE_PROVIDER_SITE_OTHER): Payer: Medicare HMO | Admitting: Gastroenterology

## 2021-04-27 ENCOUNTER — Encounter: Payer: Self-pay | Admitting: Gastroenterology

## 2021-04-27 VITALS — BP 94/62 | HR 64 | Ht 64.0 in | Wt 127.8 lb

## 2021-04-27 DIAGNOSIS — Z86010 Personal history of colonic polyps: Secondary | ICD-10-CM | POA: Diagnosis not present

## 2021-04-27 NOTE — Patient Instructions (Signed)
We changed your recall colonoscopy to 12/2021. We will send you a letter when it gets closer to that time.  The [facility] providers would like to encourage you to use [facility] to communicate with providers for non-urgent requests or questions.  Due to long hold times on the telephone, sending your provider a message by [facility] may be a faster and more efficient way to get a response.  Please allow 48 business hours for a response.  Please remember that this is for non-urgent requests.   Thank you for choosing me and [facility].  Pricilla Riffle. Dagoberto Ligas., MD., Marval Regal

## 2021-04-27 NOTE — Progress Notes (Signed)
° ° °  History of Present Illness: This is an 31 returning for follow-up of a personal history of multiple adenomatous colon polyps.  Colonoscopy as below had 17 tubular adenomas measuring between 3 mm and 9 mm.  She was advised to return for 1 year interval surveillance colonoscopy.  She has no ongoing gastrointestinal complaints.  She is followed at [facility] for a stable ascending aorta aneurysm measuring 4.7 cm and mild to moderate AI.  Follow-up with her thoracic surgeon at [facility] as recommended in 1 year which will be September 2023.  Colonoscopy 12/2019 - One 3 mm polyp in the cecum, removed with a cold biopsy forceps. Resected and retrieved. - Sixteen 5 to 9 mm polyps in the sigmoid colon, in the descending colon, in the transverse colon and in the ascending colon, removed with a cold snare. Resected and retrieved. - Moderate diverticulosis in the left colon. - The examination was otherwise normal on direct and retroflexion views. Path: all tubular adenomas  Current Medications, Allergies, Past Medical History, Past Surgical History, Family History and Social History were reviewed in [employer] record.   Physical Exam: General: Well developed, well nourished, no acute distress Head: Normocephalic and atraumatic Eyes: Sclerae anicteric, EOMI Ears: Normal auditory acuity Mouth: Not examined, mask on during Covid-19 pandemic Lungs: Clear throughout to auscultation Heart: Regular rate and rhythm; no murmurs, rubs or bruits Abdomen: Soft, non tender and non distended. No masses, hepatosplenomegaly or hernias noted. Normal Bowel sounds Rectal: Not done Musculoskeletal: Symmetrical with no gross deformities  Pulses:  Normal pulses noted Extremities: No clubbing, cyanosis, edema or deformities noted Neurological: Alert oriented x 4, grossly nonfocal Psychological:  Alert and cooperative. Normal mood and affect   Assessment and Recommendations:  Personal history of 17  tubular adenomas on colonoscopy performed in November 2021.  A 1 year interval surveillance colonoscopy was recommended.  After a discussion with the patient she would like to consider surveillance colonoscopy at a 2 year interval which is reasonable.  She is advised to call if she develops any new gastrointestinal signs or symptoms prior to her recommended visit in October 2023.

## 2021-08-09 NOTE — Telephone Encounter (Signed)
error 

## 2022-04-12 ENCOUNTER — Encounter: Payer: Self-pay | Admitting: Gastroenterology
